# Patient Record
Sex: Male | Born: 1950
Health system: Southern US, Community
[De-identification: ages and names within clinical notes are randomized; demographics above are authoritative.]

## PROBLEM LIST (undated history)

## (undated) DIAGNOSIS — E785 Hyperlipidemia, unspecified: Secondary | ICD-10-CM

## (undated) DIAGNOSIS — K219 Gastro-esophageal reflux disease without esophagitis: Secondary | ICD-10-CM

## (undated) DIAGNOSIS — I10 Essential (primary) hypertension: Secondary | ICD-10-CM

## (undated) DIAGNOSIS — I2510 Atherosclerotic heart disease of native coronary artery without angina pectoris: Secondary | ICD-10-CM

## (undated) HISTORY — DX: Essential (primary) hypertension: I10

## (undated) HISTORY — DX: Hyperlipidemia, unspecified: E78.5

## (undated) HISTORY — PX: HAND TENDON SURGERY: SHX663

## (undated) HISTORY — DX: Atherosclerotic heart disease of native coronary artery without angina pectoris: I25.10

## (undated) HISTORY — DX: Gastro-esophageal reflux disease without esophagitis: K21.9

---

## 2006-01-07 ENCOUNTER — Emergency Department (HOSPITAL_COMMUNITY): Admission: EM | Admit: 2006-01-07 | Discharge: 2006-01-08 | Payer: Self-pay | Admitting: Emergency Medicine

## 2008-08-27 DIAGNOSIS — I10 Essential (primary) hypertension: Secondary | ICD-10-CM

## 2008-08-27 HISTORY — DX: Essential (primary) hypertension: I10

## 2009-06-08 ENCOUNTER — Ambulatory Visit: Payer: Self-pay | Admitting: Thoracic Surgery (Cardiothoracic Vascular Surgery)

## 2009-06-08 ENCOUNTER — Inpatient Hospital Stay (HOSPITAL_COMMUNITY): Admission: AD | Admit: 2009-06-08 | Discharge: 2009-06-15 | Payer: Self-pay | Admitting: Cardiology

## 2009-06-08 HISTORY — PX: CARDIAC CATHETERIZATION: SHX172

## 2009-06-09 ENCOUNTER — Encounter: Payer: Self-pay | Admitting: Thoracic Surgery (Cardiothoracic Vascular Surgery)

## 2009-06-09 HISTORY — PX: OTHER SURGICAL HISTORY: SHX169

## 2009-06-10 HISTORY — PX: CORONARY ARTERY BYPASS GRAFT: SHX141

## 2009-06-10 HISTORY — PX: TEE WITHOUT CARDIOVERSION: SHX5443

## 2009-06-29 ENCOUNTER — Encounter: Admission: RE | Admit: 2009-06-29 | Discharge: 2009-06-29 | Payer: Self-pay | Admitting: Cardiovascular Disease

## 2009-07-04 ENCOUNTER — Ambulatory Visit: Payer: Self-pay | Admitting: Thoracic Surgery (Cardiothoracic Vascular Surgery)

## 2009-07-14 ENCOUNTER — Encounter (HOSPITAL_COMMUNITY): Admission: RE | Admit: 2009-07-14 | Discharge: 2009-08-26 | Payer: Self-pay | Admitting: Cardiovascular Disease

## 2009-07-29 ENCOUNTER — Emergency Department (HOSPITAL_COMMUNITY): Admission: EM | Admit: 2009-07-29 | Discharge: 2009-07-29 | Payer: Self-pay | Admitting: Emergency Medicine

## 2009-08-27 ENCOUNTER — Encounter (HOSPITAL_COMMUNITY): Admission: RE | Admit: 2009-08-27 | Discharge: 2009-09-09 | Payer: Self-pay | Admitting: Cardiovascular Disease

## 2010-09-17 ENCOUNTER — Encounter: Payer: Self-pay | Admitting: Thoracic Surgery (Cardiothoracic Vascular Surgery)

## 2010-10-31 ENCOUNTER — Encounter (INDEPENDENT_AMBULATORY_CARE_PROVIDER_SITE_OTHER): Payer: Self-pay | Admitting: *Deleted

## 2010-11-07 NOTE — Letter (Signed)
Summary: Pre Visit Letter Revised  [facility]  [address] [place], [place] [address]   Phone: [phone]  Fax: [phone]        10/31/2010 MRN: [id number] [facility] [address] [place], [place]  [address]             Procedure Date:  11-28-10           Direct Colon--Dr. Russella Dar   Welcome to the Gastroenterology Division at [facility].    You are scheduled to see a nurse for your pre-procedure visit on 11-14-10 at 3:00p.m. on the 3rd floor at [facility], [address]  We ask that you try to arrive at our office 15 minutes prior to your appointment time to allow for check-in.  Please take a minute to review the attached form.  If you answer "Yes" to one or more of the questions on the first page, we ask that you call the person listed at your earliest opportunity.  If you answer "No" to all of the questions, please complete the rest of the form and bring it to your appointment.    Your nurse visit will consist of discussing your medical and surgical history, your immediate family medical history, and your medications.   If you are unable to list all of your medications on the form, please bring the medication bottles to your appointment and we will list them.  We will need to be aware of both prescribed and over the counter drugs.  We will need to know exact dosage information as well.    Please be prepared to read and sign documents such as consent forms, a financial agreement, and acknowledgement forms.  If necessary, and with your consent, a friend or relative is welcome to sit-in on the nurse visit with you.  Please bring your insurance card so that we may make a copy of it.  If your insurance requires a referral to see a specialist, please bring your referral form from your primary care physician.  No co-pay is required for this nurse visit.     If you cannot keep your appointment, please call [phone] to cancel or reschedule prior to  your appointment date.  This allows [place] the opportunity to schedule an appointment for another patient in need of care.    Thank you for choosing [facility] for your medical needs.  We appreciate the opportunity to care for you.  Please visit [place] at our website  to learn more about our practice.  Sincerely, The Gastroenterology Division

## 2010-11-13 IMAGING — CR DG CHEST 1V PORT
1 series · 1 of 1 positions shown · non-contrast
Comparison: Chest radiograph 06/08/2009

CLINICAL DATA: Chest pain

PORTABLE CHEST - 1 VIEW

[AP]
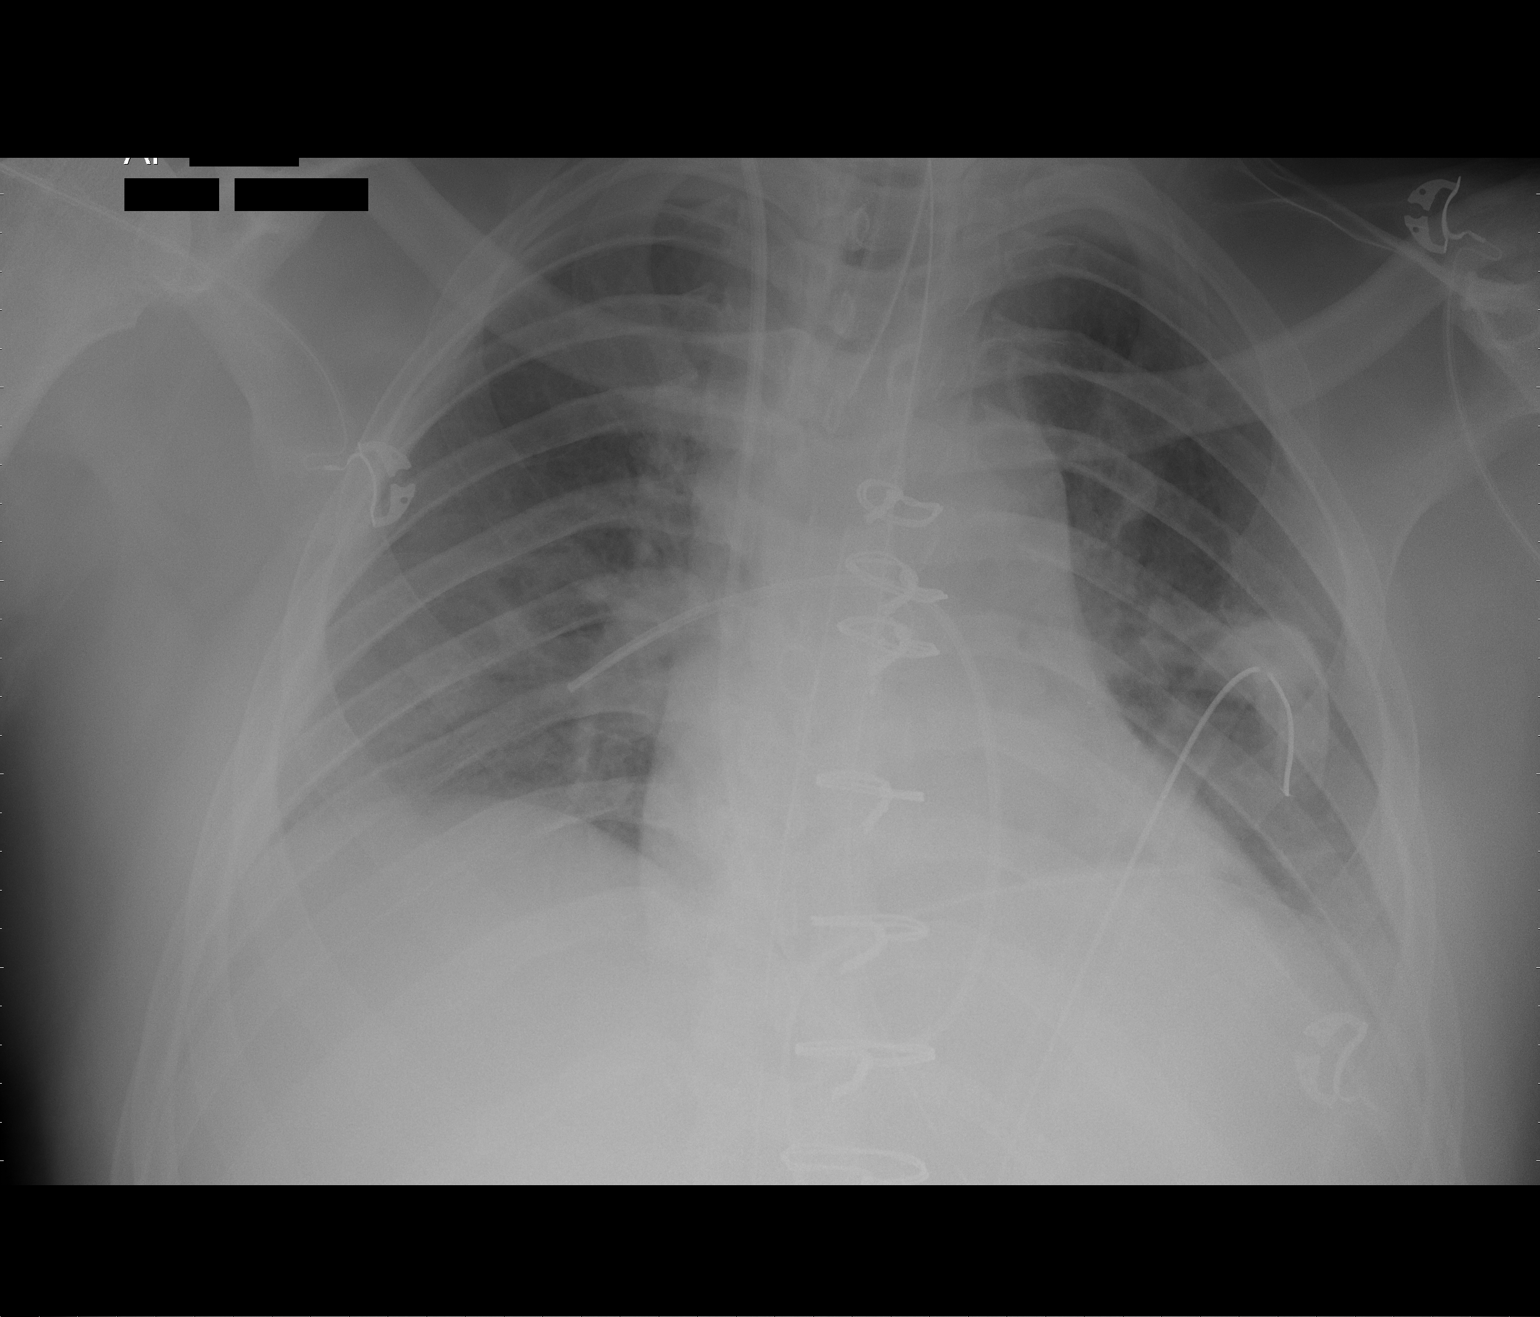

[1 of 1 positions shown; findings below may reference images not displayed]

FINDINGS: Endotracheal tube is in good position 3 cm from carina.
The Swan-Ganz catheter is deep within the right lower lobe
pulmonary artery.  Recommend retraction.  NG tube, mediastinal
drains, and left chest tube in good position.  Left basilar
atelectasis and effusion.  No pneumothorax.  The
IMPRESSION: 1.  Support apparatus in good position.
2.   Consider retraction of Swan-Ganz catheter.

This was made a call report

## 2010-11-14 ENCOUNTER — Ambulatory Visit (AMBULATORY_SURGERY_CENTER): Payer: BC Managed Care – PPO

## 2010-11-14 VITALS — Ht 70.0 in | Wt 221.1 lb

## 2010-11-14 DIAGNOSIS — Z1211 Encounter for screening for malignant neoplasm of colon: Secondary | ICD-10-CM

## 2010-11-14 IMAGING — CR DG CHEST 1V PORT
1 series · 1 of 1 positions shown · non-contrast
Comparison: 06/10/09

CLINICAL DATA: CABG.  Follow-up.

PORTABLE CHEST - 1 VIEW

[view not recorded]
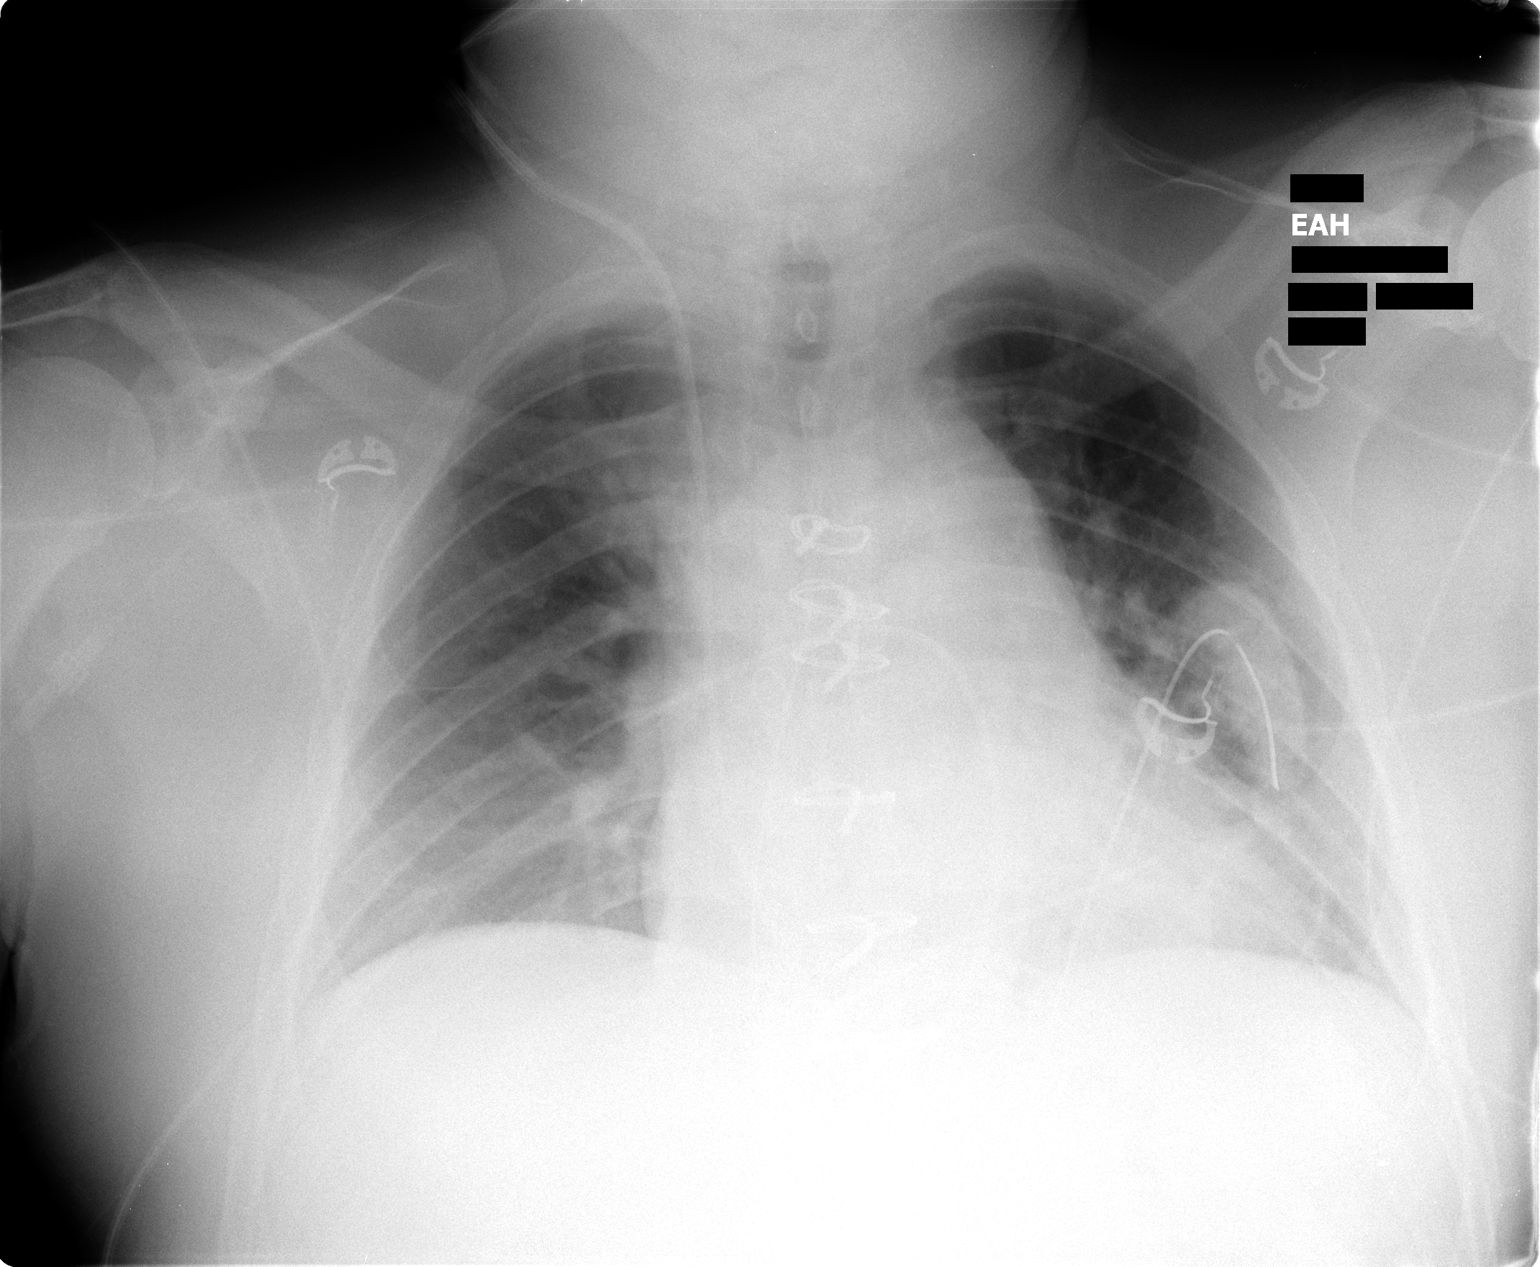

[1 of 1 positions shown; findings below may reference images not displayed]

FINDINGS: Minimal atelectasis at the bases. Mediastinal left
pleural chest tubes in position.  SGC tip is in the descending
portion of the right PA. ETT and NG tube have been removed. No
pneumothorax.  Stable cardiomediastinal silhouette.
IMPRESSION: Minimal atelectasis at the bases.  No acute interval changes.

## 2010-11-14 NOTE — Patient Instructions (Signed)
Pt to pick up prep with in 5 days 

## 2010-11-28 ENCOUNTER — Other Ambulatory Visit: Payer: Self-pay | Admitting: Gastroenterology

## 2010-11-28 LAB — DIFFERENTIAL
Basophils Absolute: 0.1 10*3/uL (ref 0.0–0.1)
Basophils Relative: 1 % (ref 0–1)
Eosinophils Absolute: 0.3 10*3/uL (ref 0.0–0.7)
Eosinophils Relative: 3 % (ref 0–5)
Lymphocytes Relative: 13 % (ref 12–46)
Lymphs Abs: 1.3 10*3/uL (ref 0.7–4.0)
Monocytes Absolute: 1.1 10*3/uL — ABNORMAL HIGH (ref 0.1–1.0)
Monocytes Relative: 10 % (ref 3–12)
Neutro Abs: 7.6 10*3/uL (ref 1.7–7.7)
Neutrophils Relative %: 74 % (ref 43–77)

## 2010-11-28 LAB — COMPREHENSIVE METABOLIC PANEL
ALT: 15 U/L (ref 0–53)
AST: 19 U/L (ref 0–37)
Albumin: 3.7 g/dL (ref 3.5–5.2)
Alkaline Phosphatase: 114 U/L (ref 39–117)
BUN: 12 mg/dL (ref 6–23)
CO2: 26 mEq/L (ref 19–32)
Calcium: 8.7 mg/dL (ref 8.4–10.5)
Chloride: 107 mEq/L (ref 96–112)
Creatinine, Ser: 1.08 mg/dL (ref 0.4–1.5)
GFR calc Af Amer: >60 mL/min (ref >60)
GFR calc non Af Amer: >60 mL/min (ref >60)
Glucose, Bld: 98 mg/dL (ref 70–99)
Potassium: 3.8 mEq/L (ref 3.5–5.1)
Sodium: 139 mEq/L (ref 135–145)
Total Bilirubin: 0.7 mg/dL (ref 0.3–1.2)
Total Protein: 6.8 g/dL (ref 6.0–8.3)

## 2010-11-28 LAB — CBC
HCT: 39.6 % (ref 39.0–52.0)
Hemoglobin: 13.7 g/dL (ref 13.0–17.0)
MCHC: 34.5 g/dL (ref 30.0–36.0)
MCV: 89.3 fL (ref 78.0–100.0)
Platelets: 165 10*3/uL (ref 150–400)
RBC: 4.43 MIL/uL (ref 4.22–5.81)
RDW: 12.6 % (ref 11.5–15.5)
WBC: 10.3 10*3/uL (ref 4.0–10.5)

## 2010-11-28 LAB — POCT CARDIAC MARKERS
CKMB, poc: <1 ng/mL — ABNORMAL LOW (ref 1.0–8.0)
Myoglobin, poc: 82.8 ng/mL (ref 12–200)
Troponin i, poc: <0.05 ng/mL (ref 0.00–0.09)

## 2010-11-30 LAB — BASIC METABOLIC PANEL
BUN: 11 mg/dL (ref 6–23)
BUN: 12 mg/dL (ref 6–23)
BUN: 13 mg/dL (ref 6–23)
BUN: 14 mg/dL (ref 6–23)
BUN: 16 mg/dL (ref 6–23)
BUN: 21 mg/dL (ref 6–23)
CO2: 23 mEq/L (ref 19–32)
CO2: 24 mEq/L (ref 19–32)
CO2: 25 mEq/L (ref 19–32)
CO2: 26 mEq/L (ref 19–32)
CO2: 27 mEq/L (ref 19–32)
CO2: 27 mEq/L (ref 19–32)
Calcium: 7.2 mg/dL — ABNORMAL LOW (ref 8.4–10.5)
Calcium: 7.8 mg/dL — ABNORMAL LOW (ref 8.4–10.5)
Calcium: 8 mg/dL — ABNORMAL LOW (ref 8.4–10.5)
Calcium: 8 mg/dL — ABNORMAL LOW (ref 8.4–10.5)
Calcium: 8.5 mg/dL (ref 8.4–10.5)
Calcium: 8.9 mg/dL (ref 8.4–10.5)
Chloride: 103 mEq/L (ref 96–112)
Chloride: 104 mEq/L (ref 96–112)
Chloride: 104 mEq/L (ref 96–112)
Chloride: 104 mEq/L (ref 96–112)
Chloride: 105 mEq/L (ref 96–112)
Chloride: 106 mEq/L (ref 96–112)
Creatinine, Ser: 0.93 mg/dL (ref 0.4–1.5)
Creatinine, Ser: 1 mg/dL (ref 0.4–1.5)
Creatinine, Ser: 1.04 mg/dL (ref 0.4–1.5)
Creatinine, Ser: 1.05 mg/dL (ref 0.4–1.5)
Creatinine, Ser: 1.15 mg/dL (ref 0.4–1.5)
Creatinine, Ser: 1.32 mg/dL (ref 0.4–1.5)
GFR calc Af Amer: >60 mL/min (ref >60)
GFR calc Af Amer: >60 mL/min (ref >60)
GFR calc Af Amer: >60 mL/min (ref >60)
GFR calc Af Amer: >60 mL/min (ref >60)
GFR calc Af Amer: >60 mL/min (ref >60)
GFR calc Af Amer: >60 mL/min (ref >60)
GFR calc non Af Amer: 56 mL/min — ABNORMAL LOW (ref >60)
GFR calc non Af Amer: >60 mL/min (ref >60)
GFR calc non Af Amer: >60 mL/min (ref >60)
GFR calc non Af Amer: >60 mL/min (ref >60)
GFR calc non Af Amer: >60 mL/min (ref >60)
GFR calc non Af Amer: >60 mL/min (ref >60)
Glucose, Bld: 108 mg/dL — ABNORMAL HIGH (ref 70–99)
Glucose, Bld: 111 mg/dL — ABNORMAL HIGH (ref 70–99)
Glucose, Bld: 112 mg/dL — ABNORMAL HIGH (ref 70–99)
Glucose, Bld: 124 mg/dL — ABNORMAL HIGH (ref 70–99)
Glucose, Bld: 88 mg/dL (ref 70–99)
Glucose, Bld: 92 mg/dL (ref 70–99)
Potassium: 3.3 mEq/L — ABNORMAL LOW (ref 3.5–5.1)
Potassium: 3.7 mEq/L (ref 3.5–5.1)
Potassium: 3.7 mEq/L (ref 3.5–5.1)
Potassium: 3.9 mEq/L (ref 3.5–5.1)
Potassium: 3.9 mEq/L (ref 3.5–5.1)
Potassium: 4.1 mEq/L (ref 3.5–5.1)
Sodium: 131 mEq/L — ABNORMAL LOW (ref 135–145)
Sodium: 132 mEq/L — ABNORMAL LOW (ref 135–145)
Sodium: 136 mEq/L (ref 135–145)
Sodium: 137 mEq/L (ref 135–145)
Sodium: 138 mEq/L (ref 135–145)
Sodium: 140 mEq/L (ref 135–145)

## 2010-11-30 LAB — POCT I-STAT 4, (NA,K, GLUC, HGB,HCT)
Glucose, Bld: 112 mg/dL — ABNORMAL HIGH (ref 70–99)
Glucose, Bld: 115 mg/dL — ABNORMAL HIGH (ref 70–99)
Glucose, Bld: 118 mg/dL — ABNORMAL HIGH (ref 70–99)
Glucose, Bld: 121 mg/dL — ABNORMAL HIGH (ref 70–99)
Glucose, Bld: 95 mg/dL (ref 70–99)
Glucose, Bld: 97 mg/dL (ref 70–99)
HCT: 28 % — ABNORMAL LOW (ref 39.0–52.0)
HCT: 29 % — ABNORMAL LOW (ref 39.0–52.0)
HCT: 29 % — ABNORMAL LOW (ref 39.0–52.0)
HCT: 34 % — ABNORMAL LOW (ref 39.0–52.0)
HCT: 35 % — ABNORMAL LOW (ref 39.0–52.0)
HCT: 40 % (ref 39.0–52.0)
Hemoglobin: 11.6 g/dL — ABNORMAL LOW (ref 13.0–17.0)
Hemoglobin: 11.9 g/dL — ABNORMAL LOW (ref 13.0–17.0)
Hemoglobin: 13.6 g/dL (ref 13.0–17.0)
Hemoglobin: 9.5 g/dL — ABNORMAL LOW (ref 13.0–17.0)
Hemoglobin: 9.9 g/dL — ABNORMAL LOW (ref 13.0–17.0)
Hemoglobin: 9.9 g/dL — ABNORMAL LOW (ref 13.0–17.0)
Potassium: 3.7 mEq/L (ref 3.5–5.1)
Potassium: 3.8 mEq/L (ref 3.5–5.1)
Potassium: 4 mEq/L (ref 3.5–5.1)
Potassium: 4.6 mEq/L (ref 3.5–5.1)
Potassium: 4.7 mEq/L (ref 3.5–5.1)
Potassium: 5.1 mEq/L (ref 3.5–5.1)
Sodium: 134 mEq/L — ABNORMAL LOW (ref 135–145)
Sodium: 136 mEq/L (ref 135–145)
Sodium: 138 mEq/L (ref 135–145)
Sodium: 138 mEq/L (ref 135–145)
Sodium: 138 mEq/L (ref 135–145)
Sodium: 140 mEq/L (ref 135–145)

## 2010-11-30 LAB — URINE CULTURE
Colony Count: NO GROWTH
Colony Count: NO GROWTH
Colony Count: NO GROWTH
Culture: NO GROWTH
Culture: NO GROWTH
Culture: NO GROWTH

## 2010-11-30 LAB — HEMOGLOBIN A1C
Hgb A1c MFr Bld: 5.9 % (ref 4.6–6.1)
Mean Plasma Glucose: 123 mg/dL

## 2010-11-30 LAB — LIPID PANEL
Cholesterol: 129 mg/dL (ref 0–200)
HDL: 35 mg/dL — ABNORMAL LOW (ref >39)
LDL Cholesterol: 79 mg/dL (ref 0–99)
Total CHOL/HDL Ratio: 3.7 RATIO
Triglycerides: 77 mg/dL (ref <150)
VLDL: 15 mg/dL (ref 0–40)

## 2010-11-30 LAB — MAGNESIUM
Magnesium: 1.9 mg/dL (ref 1.5–2.5)
Magnesium: 2 mg/dL (ref 1.5–2.5)
Magnesium: 2.3 mg/dL (ref 1.5–2.5)
Magnesium: 2.3 mg/dL (ref 1.5–2.5)
Magnesium: 2.4 mg/dL (ref 1.5–2.5)
Magnesium: 2.8 mg/dL — ABNORMAL HIGH (ref 1.5–2.5)

## 2010-11-30 LAB — CBC
HCT: 30.9 % — ABNORMAL LOW (ref 39.0–52.0)
HCT: 32.8 % — ABNORMAL LOW (ref 39.0–52.0)
HCT: 34.2 % — ABNORMAL LOW (ref 39.0–52.0)
HCT: 35.1 % — ABNORMAL LOW (ref 39.0–52.0)
HCT: 36.8 % — ABNORMAL LOW (ref 39.0–52.0)
HCT: 36.8 % — ABNORMAL LOW (ref 39.0–52.0)
HCT: 38.8 % — ABNORMAL LOW (ref 39.0–52.0)
HCT: 40 % (ref 39.0–52.0)
HCT: 40.8 % (ref 39.0–52.0)
HCT: 43 % (ref 39.0–52.0)
Hemoglobin: 10.7 g/dL — ABNORMAL LOW (ref 13.0–17.0)
Hemoglobin: 11.4 g/dL — ABNORMAL LOW (ref 13.0–17.0)
Hemoglobin: 12 g/dL — ABNORMAL LOW (ref 13.0–17.0)
Hemoglobin: 12.2 g/dL — ABNORMAL LOW (ref 13.0–17.0)
Hemoglobin: 12.6 g/dL — ABNORMAL LOW (ref 13.0–17.0)
Hemoglobin: 12.7 g/dL — ABNORMAL LOW (ref 13.0–17.0)
Hemoglobin: 13.3 g/dL (ref 13.0–17.0)
Hemoglobin: 13.5 g/dL (ref 13.0–17.0)
Hemoglobin: 14 g/dL (ref 13.0–17.0)
Hemoglobin: 14.5 g/dL (ref 13.0–17.0)
MCHC: 33.7 g/dL (ref 30.0–36.0)
MCHC: 33.8 g/dL (ref 30.0–36.0)
MCHC: 34.2 g/dL (ref 30.0–36.0)
MCHC: 34.3 g/dL (ref 30.0–36.0)
MCHC: 34.4 g/dL (ref 30.0–36.0)
MCHC: 34.5 g/dL (ref 30.0–36.0)
MCHC: 34.6 g/dL (ref 30.0–36.0)
MCHC: 34.8 g/dL (ref 30.0–36.0)
MCHC: 34.8 g/dL (ref 30.0–36.0)
MCHC: 35 g/dL (ref 30.0–36.0)
MCV: 90.3 fL (ref 78.0–100.0)
MCV: 90.5 fL (ref 78.0–100.0)
MCV: 90.8 fL (ref 78.0–100.0)
MCV: 90.9 fL (ref 78.0–100.0)
MCV: 90.9 fL (ref 78.0–100.0)
MCV: 91.1 fL (ref 78.0–100.0)
MCV: 91.2 fL (ref 78.0–100.0)
MCV: 91.6 fL (ref 78.0–100.0)
MCV: 91.7 fL (ref 78.0–100.0)
MCV: 92.6 fL (ref 78.0–100.0)
Platelets: 105 10*3/uL — ABNORMAL LOW (ref 150–400)
Platelets: 112 10*3/uL — ABNORMAL LOW (ref 150–400)
Platelets: 131 10*3/uL — ABNORMAL LOW (ref 150–400)
Platelets: 145 10*3/uL — ABNORMAL LOW (ref 150–400)
Platelets: 146 10*3/uL — ABNORMAL LOW (ref 150–400)
Platelets: 153 10*3/uL (ref 150–400)
Platelets: 155 10*3/uL (ref 150–400)
Platelets: 90 10*3/uL — ABNORMAL LOW (ref 150–400)
Platelets: 93 10*3/uL — ABNORMAL LOW (ref 150–400)
Platelets: 99 10*3/uL — ABNORMAL LOW (ref 150–400)
RBC: 3.4 MIL/uL — ABNORMAL LOW (ref 4.22–5.81)
RBC: 3.62 MIL/uL — ABNORMAL LOW (ref 4.22–5.81)
RBC: 3.79 MIL/uL — ABNORMAL LOW (ref 4.22–5.81)
RBC: 3.86 MIL/uL — ABNORMAL LOW (ref 4.22–5.81)
RBC: 3.97 MIL/uL — ABNORMAL LOW (ref 4.22–5.81)
RBC: 4.05 MIL/uL — ABNORMAL LOW (ref 4.22–5.81)
RBC: 4.23 MIL/uL (ref 4.22–5.81)
RBC: 4.36 MIL/uL (ref 4.22–5.81)
RBC: 4.47 MIL/uL (ref 4.22–5.81)
RBC: 4.72 MIL/uL (ref 4.22–5.81)
RDW: 12.9 % (ref 11.5–15.5)
RDW: 12.9 % (ref 11.5–15.5)
RDW: 13 % (ref 11.5–15.5)
RDW: 13 % (ref 11.5–15.5)
RDW: 13.1 % (ref 11.5–15.5)
RDW: 13.1 % (ref 11.5–15.5)
RDW: 13.2 % (ref 11.5–15.5)
RDW: 13.3 % (ref 11.5–15.5)
RDW: 13.3 % (ref 11.5–15.5)
RDW: 13.4 % (ref 11.5–15.5)
WBC: 10.9 10*3/uL — ABNORMAL HIGH (ref 4.0–10.5)
WBC: 11.6 10*3/uL — ABNORMAL HIGH (ref 4.0–10.5)
WBC: 11.8 10*3/uL — ABNORMAL HIGH (ref 4.0–10.5)
WBC: 11.9 10*3/uL — ABNORMAL HIGH (ref 4.0–10.5)
WBC: 11.9 10*3/uL — ABNORMAL HIGH (ref 4.0–10.5)
WBC: 12.8 10*3/uL — ABNORMAL HIGH (ref 4.0–10.5)
WBC: 13.5 10*3/uL — ABNORMAL HIGH (ref 4.0–10.5)
WBC: 8.1 10*3/uL (ref 4.0–10.5)
WBC: 9.7 10*3/uL (ref 4.0–10.5)
WBC: 9.9 10*3/uL (ref 4.0–10.5)

## 2010-11-30 LAB — POCT I-STAT, CHEM 8
BUN: 12 mg/dL (ref 6–23)
BUN: 17 mg/dL (ref 6–23)
Calcium, Ion: 1.06 mmol/L — ABNORMAL LOW (ref 1.12–1.32)
Calcium, Ion: 1.12 mmol/L (ref 1.12–1.32)
Chloride: 102 mEq/L (ref 96–112)
Chloride: 107 mEq/L (ref 96–112)
Creatinine, Ser: 0.9 mg/dL (ref 0.4–1.5)
Creatinine, Ser: 1 mg/dL (ref 0.4–1.5)
Glucose, Bld: 112 mg/dL — ABNORMAL HIGH (ref 70–99)
Glucose, Bld: 119 mg/dL — ABNORMAL HIGH (ref 70–99)
HCT: 32 % — ABNORMAL LOW (ref 39.0–52.0)
HCT: 38 % — ABNORMAL LOW (ref 39.0–52.0)
Hemoglobin: 10.9 g/dL — ABNORMAL LOW (ref 13.0–17.0)
Hemoglobin: 12.9 g/dL — ABNORMAL LOW (ref 13.0–17.0)
Potassium: 4.2 mEq/L (ref 3.5–5.1)
Potassium: 4.5 mEq/L (ref 3.5–5.1)
Sodium: 136 mEq/L (ref 135–145)
Sodium: 139 mEq/L (ref 135–145)
TCO2: 20 mmol/L (ref 0–100)
TCO2: 23 mmol/L (ref 0–100)

## 2010-11-30 LAB — POCT I-STAT 3, ART BLOOD GAS (G3+)
Acid-base deficit: 1 mmol/L (ref 0.0–2.0)
Acid-base deficit: 2 mmol/L (ref 0.0–2.0)
Acid-base deficit: 2 mmol/L (ref 0.0–2.0)
Acid-base deficit: 4 mmol/L — ABNORMAL HIGH (ref 0.0–2.0)
Bicarbonate: 21.2 mEq/L (ref 20.0–24.0)
Bicarbonate: 22.7 mEq/L (ref 20.0–24.0)
Bicarbonate: 23 mEq/L (ref 20.0–24.0)
Bicarbonate: 24.6 mEq/L — ABNORMAL HIGH (ref 20.0–24.0)
O2 Saturation: 100 %
O2 Saturation: 95 %
O2 Saturation: 96 %
O2 Saturation: 97 %
Patient temperature: 36.3
Patient temperature: 37.4
Patient temperature: 38.1
TCO2: 22 mmol/L (ref 0–100)
TCO2: 24 mmol/L (ref 0–100)
TCO2: 24 mmol/L (ref 0–100)
TCO2: 26 mmol/L (ref 0–100)
pCO2 arterial: 38 mmHg (ref 35.0–45.0)
pCO2 arterial: 38 mmHg (ref 35.0–45.0)
pCO2 arterial: 39.4 mmHg (ref 35.0–45.0)
pCO2 arterial: 41.8 mmHg (ref 35.0–45.0)
pH, Arterial: 7.343 — ABNORMAL LOW (ref 7.350–7.450)
pH, Arterial: 7.377 (ref 7.350–7.450)
pH, Arterial: 7.385 (ref 7.350–7.450)
pH, Arterial: 7.386 (ref 7.350–7.450)
pO2, Arterial: 105 mmHg — ABNORMAL HIGH (ref 80.0–100.0)
pO2, Arterial: 331 mmHg — ABNORMAL HIGH (ref 80.0–100.0)
pO2, Arterial: 78 mmHg — ABNORMAL LOW (ref 80.0–100.0)
pO2, Arterial: 80 mmHg (ref 80.0–100.0)

## 2010-11-30 LAB — GLUCOSE, CAPILLARY
Glucose-Capillary: 104 mg/dL — ABNORMAL HIGH (ref 70–99)
Glucose-Capillary: 105 mg/dL — ABNORMAL HIGH (ref 70–99)
Glucose-Capillary: 110 mg/dL — ABNORMAL HIGH (ref 70–99)
Glucose-Capillary: 112 mg/dL — ABNORMAL HIGH (ref 70–99)
Glucose-Capillary: 113 mg/dL — ABNORMAL HIGH (ref 70–99)
Glucose-Capillary: 117 mg/dL — ABNORMAL HIGH (ref 70–99)
Glucose-Capillary: 135 mg/dL — ABNORMAL HIGH (ref 70–99)
Glucose-Capillary: 89 mg/dL (ref 70–99)
Glucose-Capillary: 91 mg/dL (ref 70–99)
Glucose-Capillary: 97 mg/dL (ref 70–99)

## 2010-11-30 LAB — BLOOD GAS, ARTERIAL
Acid-Base Excess: 1.1 mmol/L (ref 0.0–2.0)
Bicarbonate: 25.2 mEq/L — ABNORMAL HIGH (ref 20.0–24.0)
FIO2: 0.21 %
O2 Saturation: 94.4 %
Patient temperature: 98
TCO2: 26.4 mmol/L (ref 0–100)
pCO2 arterial: 39.8 mmHg (ref 35.0–45.0)
pH, Arterial: 7.416 (ref 7.350–7.450)
pO2, Arterial: 67.5 mmHg — ABNORMAL LOW (ref 80.0–100.0)

## 2010-11-30 LAB — CARDIAC PANEL(CRET KIN+CKTOT+MB+TROPI)
CK, MB: 2 ng/mL (ref 0.3–4.0)
Relative Index: 1.3 (ref 0.0–2.5)
Total CK: 151 U/L (ref 7–232)
Troponin I: 0.04 ng/mL (ref 0.00–0.06)

## 2010-11-30 LAB — URINALYSIS, ROUTINE W REFLEX MICROSCOPIC
Bilirubin Urine: NEGATIVE
Glucose, UA: NEGATIVE mg/dL
Ketones, ur: NEGATIVE mg/dL
Leukocytes, UA: NEGATIVE
Nitrite: NEGATIVE
Protein, ur: NEGATIVE mg/dL
Specific Gravity, Urine: >1.046 — ABNORMAL HIGH (ref 1.005–1.030)
Urobilinogen, UA: 0.2 mg/dL (ref 0.0–1.0)
pH: 7 (ref 5.0–8.0)

## 2010-11-30 LAB — COMPREHENSIVE METABOLIC PANEL
ALT: 37 U/L (ref 0–53)
AST: 33 U/L (ref 0–37)
Albumin: 3.6 g/dL (ref 3.5–5.2)
Alkaline Phosphatase: 90 U/L (ref 39–117)
BUN: 12 mg/dL (ref 6–23)
CO2: 27 mEq/L (ref 19–32)
Calcium: 8.8 mg/dL (ref 8.4–10.5)
Chloride: 106 mEq/L (ref 96–112)
Creatinine, Ser: 1.02 mg/dL (ref 0.4–1.5)
GFR calc Af Amer: >60 mL/min (ref >60)
GFR calc non Af Amer: >60 mL/min (ref >60)
Glucose, Bld: 87 mg/dL (ref 70–99)
Potassium: 4 mEq/L (ref 3.5–5.1)
Sodium: 139 mEq/L (ref 135–145)
Total Bilirubin: 0.7 mg/dL (ref 0.3–1.2)
Total Protein: 6.7 g/dL (ref 6.0–8.3)

## 2010-11-30 LAB — MRSA PCR SCREENING: MRSA by PCR: NEGATIVE

## 2010-11-30 LAB — PLATELET COUNT: Platelets: 116 10*3/uL — ABNORMAL LOW (ref 150–400)

## 2010-11-30 LAB — HEMOGLOBIN AND HEMATOCRIT, BLOOD
HCT: 29.5 % — ABNORMAL LOW (ref 39.0–52.0)
Hemoglobin: 10.2 g/dL — ABNORMAL LOW (ref 13.0–17.0)

## 2010-11-30 LAB — DIFFERENTIAL
Basophils Absolute: 0 10*3/uL (ref 0.0–0.1)
Basophils Relative: 0 % (ref 0–1)
Eosinophils Absolute: 0.1 10*3/uL (ref 0.0–0.7)
Eosinophils Relative: 2 % (ref 0–5)
Lymphocytes Relative: 21 % (ref 12–46)
Lymphs Abs: 1.7 10*3/uL (ref 0.7–4.0)
Monocytes Absolute: 0.8 10*3/uL (ref 0.1–1.0)
Monocytes Relative: 10 % (ref 3–12)
Neutro Abs: 5.4 10*3/uL (ref 1.7–7.7)
Neutrophils Relative %: 66 % (ref 43–77)

## 2010-11-30 LAB — APTT
aPTT: 30 seconds (ref 24–37)
aPTT: 39 seconds — ABNORMAL HIGH (ref 24–37)

## 2010-11-30 LAB — URINALYSIS, MICROSCOPIC ONLY
Bilirubin Urine: NEGATIVE
Glucose, UA: NEGATIVE mg/dL
Ketones, ur: 15 mg/dL — AB
Nitrite: NEGATIVE
Protein, ur: NEGATIVE mg/dL
Specific Gravity, Urine: 1.031 — ABNORMAL HIGH (ref 1.005–1.030)
Urobilinogen, UA: 0.2 mg/dL (ref 0.0–1.0)
pH: 6 (ref 5.0–8.0)

## 2010-11-30 LAB — URINE MICROSCOPIC-ADD ON

## 2010-11-30 LAB — CREATININE, SERUM
Creatinine, Ser: 0.94 mg/dL (ref 0.4–1.5)
Creatinine, Ser: 0.97 mg/dL (ref 0.4–1.5)
GFR calc Af Amer: >60 mL/min (ref >60)
GFR calc Af Amer: >60 mL/min (ref >60)
GFR calc non Af Amer: >60 mL/min (ref >60)
GFR calc non Af Amer: >60 mL/min (ref >60)

## 2010-11-30 LAB — PROTIME-INR
INR: 1.16 (ref 0.00–1.49)
INR: 1.64 — ABNORMAL HIGH (ref 0.00–1.49)
Prothrombin Time: 14.7 seconds (ref 11.6–15.2)
Prothrombin Time: 19.3 seconds — ABNORMAL HIGH (ref 11.6–15.2)

## 2010-11-30 LAB — ABO/RH: ABO/RH(D): O POS

## 2010-11-30 LAB — HEPARIN LEVEL (UNFRACTIONATED)
Heparin Unfractionated: 0.41 IU/mL (ref 0.30–0.70)
Heparin Unfractionated: 0.66 IU/mL (ref 0.30–0.70)
Heparin Unfractionated: 0.92 IU/mL — ABNORMAL HIGH (ref 0.30–0.70)

## 2010-11-30 LAB — TYPE AND SCREEN
ABO/RH(D): O POS
Antibody Screen: NEGATIVE

## 2010-11-30 LAB — TSH: TSH: 1.302 u[IU]/mL (ref 0.350–4.500)

## 2010-12-02 IMAGING — CR DG CHEST 2V
2 series · 2 of 2 positions shown · non-contrast
Comparison: 06/14/2009

CLINICAL DATA: Post CABG.

CHEST - 2 VIEW

[view not recorded (1 of 2)]
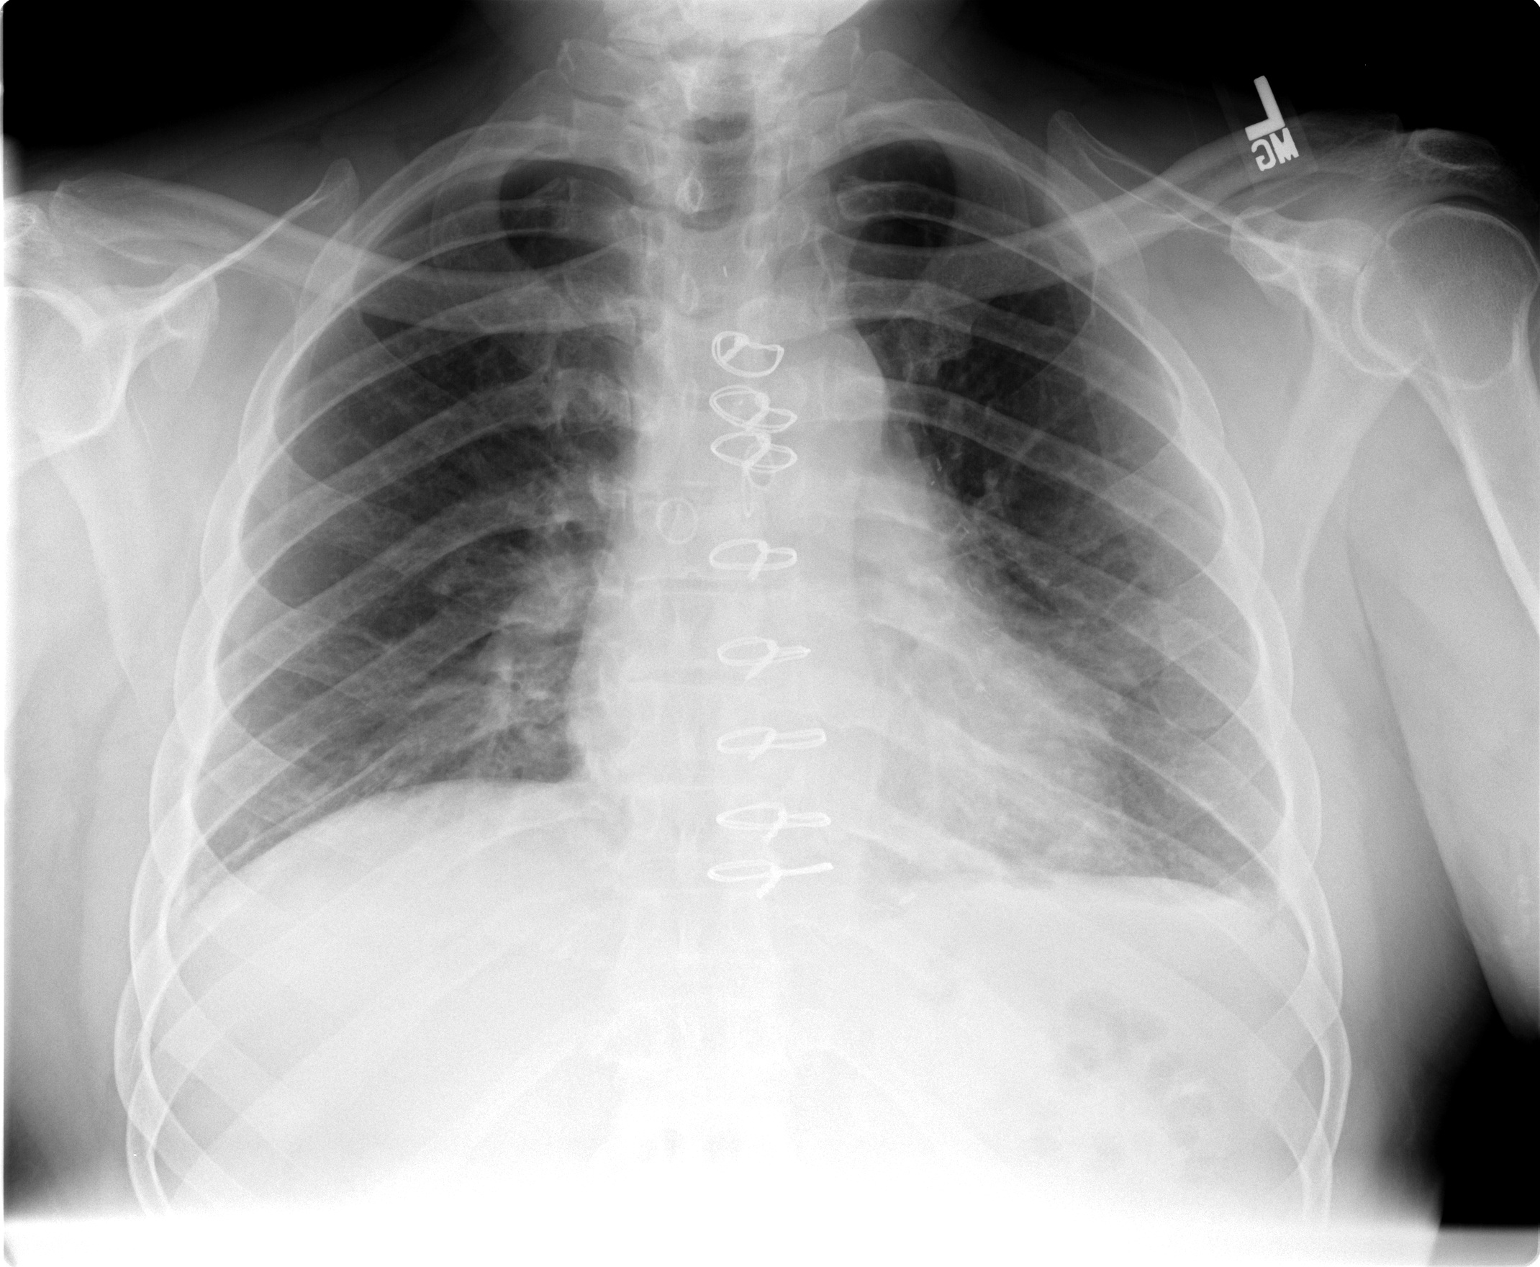

[view not recorded (2 of 2)]
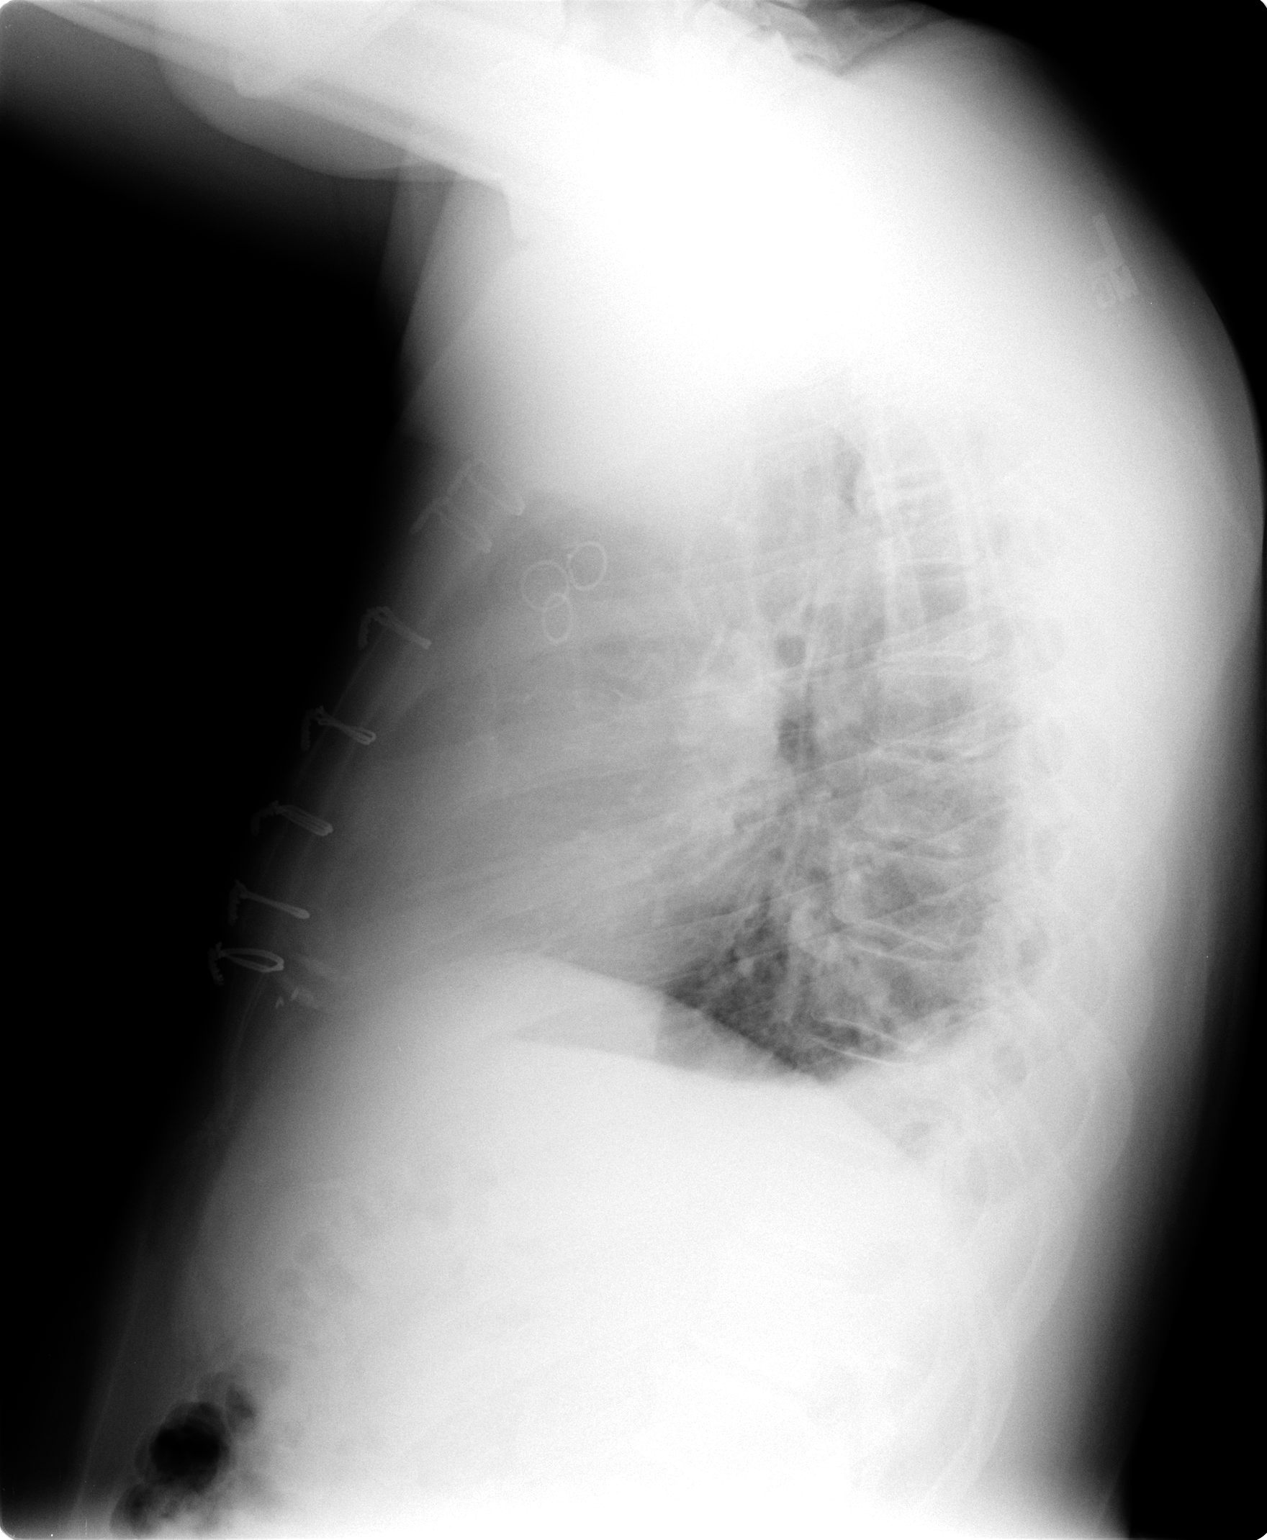

[2 of 2 positions shown; findings below may reference images not displayed]

FINDINGS: Trachea is midline.  Heart size normal.  Sternotomy wires
are stable in position.  Minimal bibasilar atelectasis and tiny
bilateral pleural effusions.
IMPRESSION: Minimal bibasilar atelectasis and tiny bilateral pleural effusions.

## 2011-01-09 NOTE — Assessment & Plan Note (Signed)
OFFICE VISIT   SON, Don Aguilar  DOB:  03/17/1951                                        July 04, 2009  CHART #:  [id number]   HISTORY:  The patient returns to the office today for routine follow up  status post coronary artery bypass grafting x4 on June 10, 2009.  His  postoperative recovery has been uncomplicated.  Following hospital  discharge, he has continued to do well.  He was seen in the office last  week by Dr. Tresa Endo and he returns to our office for a routine follow up  today.  The patient reports that he now has only mild residual soreness  in his chest.  He is no longer taking any pain medicine.  His appetite  has been improving.  He is now sleeping well at night.  He has not had  problems with shortness of breath.  Overall, he has no complaints.  Medications remain unchanged from the time of hospital discharge.   PHYSICAL EXAMINATION:  Notable for well-appearing male with blood  pressure 109/67, pulse 76 and regular, oxygen saturation 96% on room  air.  Examination of the chest demonstrates a median sternotomy incision  that is healing nicely.  The sternum is stable on palpation.  Breath  sounds are clear to auscultation and symmetrical bilaterally.  No  wheezes or rhonchi are noted.  Cardiovascular exam includes regular rate  and rhythm.  No murmurs, rubs, or gallops are noted.  The abdomen is  soft, nontender.  The extremities are warm and well perfused.  The small  incision below the right knee from endoscopic vein harvest is healing  nicely.  There is no lower extremity edema.  The remainder of his  physical exam is unremarkable.   DIAGNOSTIC TESTS:  Chest x-ray performed on June 29, 2009, is  reviewed.  This demonstrates clear lung fields bilaterally with minimal  bibasilar atelectasis.  There are no pleural effusions.  No other  abnormalities are noted.  All the sternal wires appear intact.   IMPRESSION:  Satisfactory progress  following recent coronary artery  bypass grafting.   PLAN:  I have encouraged the patient to continue to gradually increase  his physical activity as tolerated with his only limitation at this  point remaining that he refrain from heavy lifting or strenuous use of  his arms or shoulders for at least another 2-3 months.  I have strongly  encouraged him to get involved in the cardiac rehab program.  We have  not made any changes in his current medications.  All of his questions  have been addressed.  In the future, the patient will call and return to  see [place] here at [facility] only should further  problems or difficulties arise.   Salvatore Decent. Cornelius Moras, M.D.  Electronically Signed   CHO/MEDQ  D:  07/04/2009  T:  07/04/2009  Job:  [id number]   cc:   Nicki Guadalajara, M.D.  Sheliah Mends, MD

## 2012-05-30 HISTORY — PX: CARDIOVASCULAR STRESS TEST: SHX262

## 2013-02-23 ENCOUNTER — Other Ambulatory Visit: Payer: Self-pay | Admitting: Cardiovascular Disease

## 2013-02-23 NOTE — Telephone Encounter (Signed)
Rx was sent to pharmacy electronically. 

## 2013-03-23 ENCOUNTER — Other Ambulatory Visit: Payer: Self-pay | Admitting: *Deleted

## 2013-03-23 MED ORDER — ROSUVASTATIN CALCIUM 10 MG PO TABS: 10.0000 mg | ORAL_TABLET | Freq: Every day | ORAL | Status: DC

## 2013-04-17 ENCOUNTER — Telehealth: Payer: Self-pay | Admitting: Family Medicine

## 2013-04-17 MED ORDER — OMEPRAZOLE 20 MG PO CPDR: 20.0000 mg | DELAYED_RELEASE_CAPSULE | Freq: Every day | ORAL | Status: DC

## 2013-04-17 NOTE — Telephone Encounter (Signed)
Omeprazole 20 mg cap 1 BID #60 °

## 2013-04-17 NOTE — Telephone Encounter (Signed)
Medication refilled per protocol. 

## 2013-04-23 ENCOUNTER — Telehealth: Payer: Self-pay | Admitting: Family Medicine

## 2013-04-23 MED ORDER — OMEPRAZOLE 20 MG PO CPDR: 20.0000 mg | DELAYED_RELEASE_CAPSULE | Freq: Two times a day (BID) | ORAL | Status: DC

## 2013-04-23 NOTE — Telephone Encounter (Signed)
Rx Refilled  

## 2013-04-23 NOTE — Telephone Encounter (Signed)
Omeprazole 20 mg cap 1 BID #60

## 2013-07-27 ENCOUNTER — Other Ambulatory Visit: Payer: Self-pay | Admitting: Cardiovascular Disease

## 2013-07-27 NOTE — Telephone Encounter (Signed)
Rx was sent to pharmacy electronically. 

## 2013-07-30 ENCOUNTER — Other Ambulatory Visit: Payer: Self-pay | Admitting: Cardiovascular Disease

## 2013-07-30 NOTE — Telephone Encounter (Signed)
Rx was sent to pharmacy electronically. 

## 2013-08-29 ENCOUNTER — Other Ambulatory Visit: Payer: Self-pay | Admitting: Cardiovascular Disease

## 2013-09-02 ENCOUNTER — Other Ambulatory Visit: Payer: Self-pay | Admitting: Cardiovascular Disease

## 2013-09-02 NOTE — Telephone Encounter (Signed)
Rx was sent to pharmacy electronically. 

## 2013-09-04 ENCOUNTER — Other Ambulatory Visit: Payer: Self-pay | Admitting: Cardiovascular Disease

## 2013-09-04 NOTE — Telephone Encounter (Signed)
Rx was sent to pharmacy electronically. 

## 2013-09-10 ENCOUNTER — Encounter: Payer: Self-pay | Admitting: Family Medicine

## 2013-09-10 NOTE — Telephone Encounter (Signed)
PT wife is calling today in regards to her husbands order for his Colonoscopy  Call back number is [phone]

## 2013-09-18 NOTE — Telephone Encounter (Signed)
This encounter was created in error - please disregard.

## 2013-09-20 ENCOUNTER — Other Ambulatory Visit: Payer: Self-pay | Admitting: Cardiovascular Disease

## 2013-09-22 ENCOUNTER — Other Ambulatory Visit: Payer: Self-pay | Admitting: Cardiovascular Disease

## 2013-10-02 ENCOUNTER — Ambulatory Visit (INDEPENDENT_AMBULATORY_CARE_PROVIDER_SITE_OTHER): Payer: BC Managed Care – PPO | Admitting: Family Medicine

## 2013-10-02 ENCOUNTER — Encounter: Payer: Self-pay | Admitting: Family Medicine

## 2013-10-02 VITALS — BP 118/60 | HR 62 | Temp 97.6°F | Resp 18 | Ht 69.0 in | Wt 228.0 lb

## 2013-10-02 DIAGNOSIS — J209 Acute bronchitis, unspecified: Secondary | ICD-10-CM

## 2013-10-02 DIAGNOSIS — Z125 Encounter for screening for malignant neoplasm of prostate: Secondary | ICD-10-CM

## 2013-10-02 DIAGNOSIS — I2510 Atherosclerotic heart disease of native coronary artery without angina pectoris: Secondary | ICD-10-CM

## 2013-10-02 DIAGNOSIS — Z86010 Personal history of colonic polyps: Secondary | ICD-10-CM

## 2013-10-02 MED ORDER — AZITHROMYCIN 250 MG PO TABS: ORAL_TABLET | ORAL | Status: DC

## 2013-10-02 MED ORDER — ALBUTEROL SULFATE HFA 108 (90 BASE) MCG/ACT IN AERS: 2.0000 | INHALATION_SPRAY | Freq: Four times a day (QID) | RESPIRATORY_TRACT | Status: DC | PRN

## 2013-10-02 NOTE — Progress Notes (Signed)
Subjective:    Patient ID: Don Aguilar, male    DOB: Nov 30, 1950, 63 y.o.   MRN: [id number]  HPI  Patient saw a gastroenterologist in [facility] one year ago and had a colonoscopy.  They found a colon polyp that they were unable to biopsy. At that time they referred him to a general surgeon for an open biopsy. The general surgeon recommended a referral [facility] for a repeat colonoscopy when they may be able to perform the biopsy of polyp without requiring such an invasive procedure. I have no records to review today. The patient was unsure whether they actually biopsied the polyp or if they found cancer.   Furthermore he has a history of coronary artery disease. He is on Toprol Crestor and Ramapril.  He has not had blood work in over a year and a half.. He also has had one week history of cough. The cough is productive of purulent sputum. He reports fevers and chills. He reports wheezing and shortness of breath. Past Medical History  Diagnosis Date  . Hypertension 2010  . GERD (gastroesophageal reflux disease)    Current Outpatient Prescriptions on File Prior to Visit  Medication Sig Dispense Refill  . aspirin 81 MG tablet Take 81 mg by mouth daily.        . CRESTOR 10 MG tablet TAKE ONE TABLET BY MOUTH ONCE DAILY. (PATIENT NEEDS TO CALL OFFICE TO SCHEDULE APPOINTMENT).  15 tablet  0  . omeprazole (PRILOSEC) 20 MG capsule Take 1 capsule (20 mg total) by mouth 2 (two) times daily.  60 capsule  5   No current facility-administered medications on file prior to visit.   No Known Allergies History   Social History  . Marital Status: Married    Spouse Name: N/A    Number of Children: N/A  . Years of Education: N/A   Occupational History  . Not on file.   Social History Main Topics  . Smoking status: Former Smoker -- 2.00 packs/day for 13 years    Types: Cigarettes    Quit date: 03/15/1976  . Smokeless tobacco: Never Used  . Alcohol Use: No  . Drug Use:  No  . Sexual Activity: Not on file   Other Topics Concern  . Not on file   Social History Narrative  . No narrative on file     Review of Systems  All other systems reviewed and are negative.       Objective:   Physical Exam  Vitals reviewed. Constitutional: He appears well-developed and well-nourished.  Neck: Neck supple. No JVD present. No thyromegaly present.  Cardiovascular: Normal rate, regular rhythm and normal heart sounds.   Pulmonary/Chest: Effort normal. He has wheezes. He has rales.  Abdominal: Soft. Bowel sounds are normal. He exhibits no distension. There is no tenderness. There is no rebound.  Lymphadenopathy:    He has no cervical adenopathy.          Assessment & Plan:  1. Acute bronchitis 2 acute asthmatic bronchitis with a Z-Pak, albuterol 2 puffs inhaled every 6 hours as needed, and Mucinex. If wheezing worsens we will need to add prednisone. - azithromycin (ZITHROMAX) 250 MG tablet; 2 tabs poqday1, 1 tab poqday 2-5  Dispense: 6 tablet; Refill: 0 - albuterol (PROVENTIL HFA;VENTOLIN HFA) 108 (90 BASE) MCG/ACT inhaler; Inhale 2 puffs into the lungs every 6 (six) hours as needed for wheezing or shortness of breath.  Dispense: 1 Inhaler; Refill: 0  2.  Personal history of colonic polyps Consult GI for repeat colonoscopy at [facility] - Ambulatory referral to Gastroenterology  3. CAD (coronary artery disease) Return fasting for CBC, CMP, and fasting lipid panel. His blood pressures well controlled today. - COMPLETE METABOLIC PANEL WITH GFR; Future - CBC with Differential; Future - Lipid panel; Future  4. Screening for prostate cancer Given his age the patient is also overdue for a PSA. - PSA; Future

## 2013-10-06 ENCOUNTER — Other Ambulatory Visit: Payer: Self-pay | Admitting: Cardiovascular Disease

## 2013-10-08 ENCOUNTER — Other Ambulatory Visit: Payer: Self-pay | Admitting: Cardiovascular Disease

## 2013-10-10 ENCOUNTER — Other Ambulatory Visit: Payer: Self-pay | Admitting: Cardiovascular Disease

## 2013-10-19 ENCOUNTER — Encounter: Payer: Self-pay | Admitting: Cardiovascular Disease

## 2013-10-19 ENCOUNTER — Ambulatory Visit (INDEPENDENT_AMBULATORY_CARE_PROVIDER_SITE_OTHER): Payer: BC Managed Care – PPO | Admitting: Cardiovascular Disease

## 2013-10-19 VITALS — BP 120/80 | HR 66 | Ht 69.0 in | Wt 230.6 lb

## 2013-10-19 DIAGNOSIS — E669 Obesity, unspecified: Secondary | ICD-10-CM

## 2013-10-19 DIAGNOSIS — G4733 Obstructive sleep apnea (adult) (pediatric): Secondary | ICD-10-CM

## 2013-10-19 DIAGNOSIS — R0602 Shortness of breath: Secondary | ICD-10-CM

## 2013-10-19 DIAGNOSIS — I10 Essential (primary) hypertension: Secondary | ICD-10-CM

## 2013-10-19 DIAGNOSIS — I2510 Atherosclerotic heart disease of native coronary artery without angina pectoris: Secondary | ICD-10-CM

## 2013-10-19 DIAGNOSIS — E785 Hyperlipidemia, unspecified: Secondary | ICD-10-CM

## 2013-10-19 DIAGNOSIS — R5381 Other malaise: Secondary | ICD-10-CM

## 2013-10-19 DIAGNOSIS — R5383 Other fatigue: Secondary | ICD-10-CM

## 2013-10-19 MED ORDER — RAMIPRIL 10 MG PO CAPS: 10.0000 mg | ORAL_CAPSULE | Freq: Every day | ORAL | Status: DC

## 2013-10-19 MED ORDER — ROSUVASTATIN CALCIUM 10 MG PO TABS: 10.0000 mg | ORAL_TABLET | Freq: Every day | ORAL | Status: DC

## 2013-10-19 MED ORDER — METOPROLOL TARTRATE 100 MG PO TABS: ORAL_TABLET | ORAL | Status: DC

## 2013-10-19 NOTE — Patient Instructions (Addendum)
Your physician has requested that you have an echocardiogram. Echocardiography is a painless test that uses sound waves to create images of your heart. It provides your doctor with information about the size and shape of your heart and how well your heart's chambers and valves are working. This procedure takes approximately one hour. There are no restrictions for this procedure.   Your physician recommends that you return for lab work.  Your physician recommends that you schedule a follow-up appointment in: 4 months. And

## 2013-10-19 NOTE — Progress Notes (Signed)
Patient ID: Don Aguilar, male   DOB: 08-08-1951, 63 y.o.   MRN: [id number]     HPI: Don Aguilar is a 63 y.o. male who presents to the office for an 18 month followup cardiology evaluation.  Mr. was have severe multivessel CAD in October 2010 and was referred for CABG revascularization surgery this was done by Dr. Lucianne Lei trite with the LIMA to the LAD, a vein to the first diagonal, vein to the circumflex marginal, and vein to the distal right cardiac artery. Additional problems include obesity, hypertension, as well as mixed hyperlipidemia. He also has a history of mild obstructive sleep apnea which was diagnosed 2 years ago. He did undergo a CPAP titration trial at 11 cm water pressure was recommended but he never followed up and had CPAP initiation. His AHI was 8.5 per hour and his RDI was 25.4 per hour. During REM sleep DHI was 10.8 and RDI 23.1.  Over the past year and a half, he does admit to shortness of breath with activity but denies any chest tightness. He has gained weight. He does note daytime sleepiness and snoring and wakes up at least 2 times per night while sleeping. He can easily take a nap during the day if the circumstances permit suggestive of hypersomnolence. He is unaware of palpitations. He is on Crestor for hyperlipidemia and is on metoprolol 150 milligrams twice a day and ramipril 10 mg daily. He presents for evaluation.  Past Medical History  Diagnosis Date  . Hypertension 2010  . GERD (gastroesophageal reflux disease)   . Hyperlipidemia   . Coronary artery disease     Past Surgical History  Procedure Laterality Date  . Hand tendon surgery  left hand, 1972  . Cardiac catheterization  06/08/2009    No intervention - recommend CABG  . Vascular evaluation  06/09/2009    No significant extracranial carotid artery stenosis demonstrated. Vertebrals are patent with antegrade flow.  . Coronary artery bypass graft  06/10/2009    x4. LIMA to LAD, SVG to first diag, SVG to  circumflex, SVG to RCA. Endoscopic vein harvest from right thigh and right lower leg.  . Cardiovascular stress test  05/30/2012    Small area of possible ischemia in basal septal wall. ECG negative for ischemia, but frequent PVCs continued from mid to peak exercise with  Bigeminy pattern.  Darden Dates without cardioversion  06/10/2009    EF 60-65%, Normal-trace findings    Allergies  Allergen Reactions  . Niaspan [Niacin Er] Itching    Lower legs    Current Outpatient Prescriptions  Medication Sig Dispense Refill  . albuterol (PROVENTIL HFA;VENTOLIN HFA) 108 (90 BASE) MCG/ACT inhaler Inhale 2 puffs into the lungs every 6 (six) hours as needed for wheezing or shortness of breath.  1 Inhaler  0  . aspirin 81 MG tablet Take 81 mg by mouth daily.        . metoprolol (LOPRESSOR) 100 MG tablet Take one & one-half tablets twice daily..  90 tablet  11  . omeprazole (PRILOSEC) 20 MG capsule Take 1 capsule (20 mg total) by mouth 2 (two) times daily.  60 capsule  5  . ramipril (ALTACE) 10 MG capsule Take 1 capsule (10 mg total) by mouth daily.  30 capsule  11  . rosuvastatin (CRESTOR) 10 MG tablet Take 1 tablet (10 mg total) by mouth daily.  30 tablet  11   No current facility-administered medications for this visit.    History   Social History  .  Marital Status: Married    Spouse Name: N/A    Number of Children: N/A  . Years of Education: N/A   Occupational History  . Not on file.   Social History Main Topics  . Smoking status: Former Smoker -- 2.00 packs/day for 13 years    Types: Cigarettes    Quit date: 03/15/1976  . Smokeless tobacco: Never Used  . Alcohol Use: No  . Drug Use: No  . Sexual Activity: Not on file   Other Topics Concern  . Not on file   Social History Narrative  . 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 He is married has 3 children 2 grandchildren.  Family History  Problem Relation Age of Onset  . Heart failure Mother   . Heart failure Father     . Cancer Sister     ROS is negative for fevers, chills or night sweats.   He denies skin rash. He denies change in vision or hearing. His unaware of lymphadenopathy. He admits to weight gain. He denies PND or orthopnea. He denies palpitations presyncope or syncope. There is no chest pressure. He does note shortness of breath with walking. He denies nausea vomiting or diarrhea. There is no blood in stool or urine. He denies claudication. He denies significant leg swelling. He does admit to being tired during the day and his sleep is nonrestorative. He does snore. There is no history of hypothyroidism were no diabetes. Other comprehensive 14 point system review is negative.  PE BP 120/80  Pulse 66  Ht 5' 9"  (1.753 m)  Wt 230 lb 9.6 oz (104.599 kg)  BMI 34.04 kg/m2  General: Alert, oriented, no distress moderate obesity Skin: normal turgor, no rashes HEENT: Normocephalic, atraumatic. Pupils round and reactive; sclera anicteric;no lid lag. Extraocular muscles intact;; no xanthelasmas. Nose without nasal septal hypertrophy Mouth/Parynx benign; Mallinpatti scale 3 Neck: No JVD, no carotid bruits; normal carotid upstroke Lungs: clear to ausculatation and percussion; no wheezing or rales Chest wall: no tenderness to palpitation Heart: RRR, s1 s2 normal;  1/6 systolic murmur,no diastolic murmur, rub thrills or heaves Abdomen: soft, nontender; no hepatosplenomehaly, BS+; abdominal aorta nontender and not dilated by palpation. Back: no CVA tenderness Pulses 2+ Extremities: no clubbing cyanosis or edema, Homan's sign negative  Neurologic: grossly nonfocal; cranial nerves grossly normal. Psychologic: normal affect and mood.  ECG (independently read by me): Normal sinus rhythm at 66 beats per minute. Nonspecific T. wave abnormality.  LABS:  BMET    Component Value Date/Time   NA 139 07/29/2009 1521   K 3.8 07/29/2009 1521   CL 107 07/29/2009 1521   CO2 26 07/29/2009 1521   GLUCOSE 98 07/29/2009  1521   BUN 12 07/29/2009 1521   CREATININE 1.08 07/29/2009 1521   CALCIUM 8.7 07/29/2009 1521   GFRNONAA >60 07/29/2009 1521   GFRAA  Value: >60        The eGFR has been calculated using the MDRD equation. This calculation has not been validated in all clinical situations. eGFR's persistently <60 mL/min signify possible Chronic Kidney Disease. 07/29/2009 1521     Hepatic Function Panel     Component Value Date/Time   PROT 6.8 07/29/2009 1521   ALBUMIN 3.7 07/29/2009 1521   AST 19 07/29/2009 1521   ALT 15 07/29/2009 1521   ALKPHOS 114 07/29/2009 1521   BILITOT 0.7 07/29/2009 1521     CBC    Component Value Date/Time   WBC 10.3 07/29/2009 1521  RBC 4.43 07/29/2009 1521   HGB 13.7 07/29/2009 1521   HCT 39.6 07/29/2009 1521   PLT 165 07/29/2009 1521   MCV 89.3 07/29/2009 1521   MCHC 34.5 07/29/2009 1521   RDW 12.6 07/29/2009 1521   LYMPHSABS 1.3 07/29/2009 1521   MONOABS 1.1* 07/29/2009 1521   EOSABS 0.3 07/29/2009 1521   BASOSABS 0.1 07/29/2009 1521     BNP No results found for this basename: probnp    Lipid Panel     Component Value Date/Time   CHOL  Value: 129        ATP III CLASSIFICATION:  <200     mg/dL   Desirable  200-239  mg/dL   Borderline High  >=240    mg/dL   High        06/10/2009 0336   TRIG 77 06/10/2009 0336   HDL 35* 06/10/2009 0336   CHOLHDL 3.7 06/10/2009 0336   VLDL 15 06/10/2009 0336   LDLCALC  Value: 79        Total Cholesterol/HDL:CHD Risk Coronary Heart Disease Risk Table                     Men   Women  1/2 Average Risk   3.4   3.3  Average Risk       5.0   4.4  2 X Average Risk   9.6   7.1  3 X Average Risk  23.4   11.0        Use the calculated Patient Ratio above and the CHD Risk Table to determine the patient's CHD Risk.        ATP III CLASSIFICATION (LDL):  <100     mg/dL   Optimal  100-129  mg/dL   Near or Above                    Optimal  130-159  mg/dL   Borderline  160-189  mg/dL   High  >190     mg/dL   Very High 06/10/2009 0336     RADIOLOGY: No  results found.    ASSESSMENT AND PLAN: Mr. Haven Foss is a 63 year old African American male who does have a history of moderate obesity in October 2010 was found to have severe multivessel CAD and he developed exertional chest pain symptomatology suggestive of ischemia. He underwent CABG surgery due to severe multivessel CAD. His last echo Doppler study was preoperatively. His last nuclear perfusion study was in October 2013 which revealed mild attenuation artifact. He did develop ectopy but his beta blocker was held for the past. We discussed his weight gain and the potential for 30 pound weight loss. He has no shortness of breath with activity. I am recommending an echo Doppler study be done to further evaluate systolic and diastolic function. I also again discussed his obstructive sleep apnea particularly with his cardiovascular comorbidities. With his weight gain, if he desires to try CPAP it may be worthwhile to consider a split night study or possibly initiate therapy if it can be initiated at this point with a CPAP auto unit. I'll see him in 4 months for followup evaluation.     Troy Sine, MD, [facility]  10/19/2013 5:11 PM

## 2013-10-26 ENCOUNTER — Other Ambulatory Visit: Payer: BC Managed Care – PPO

## 2013-10-26 DIAGNOSIS — I2510 Atherosclerotic heart disease of native coronary artery without angina pectoris: Secondary | ICD-10-CM

## 2013-10-26 DIAGNOSIS — Z125 Encounter for screening for malignant neoplasm of prostate: Secondary | ICD-10-CM

## 2013-10-26 LAB — CBC WITH DIFFERENTIAL/PLATELET
Basophils Absolute: 0 10*3/uL (ref 0.0–0.1)
Basophils Relative: 0 % (ref 0–1)
Eosinophils Absolute: 0.1 10*3/uL (ref 0.0–0.7)
Eosinophils Relative: 2 % (ref 0–5)
HCT: 42.9 % (ref 39.0–52.0)
Hemoglobin: 14.6 g/dL (ref 13.0–17.0)
Lymphocytes Relative: 35 % (ref 12–46)
Lymphs Abs: 2.6 10*3/uL (ref 0.7–4.0)
MCH: 29.8 pg (ref 26.0–34.0)
MCHC: 34 g/dL (ref 30.0–36.0)
MCV: 87.6 fL (ref 78.0–100.0)
Monocytes Absolute: 0.7 10*3/uL (ref 0.1–1.0)
Monocytes Relative: 9 % (ref 3–12)
Neutro Abs: 3.9 10*3/uL (ref 1.7–7.7)
Neutrophils Relative %: 54 % (ref 43–77)
Platelets: 160 10*3/uL (ref 150–400)
RBC: 4.9 MIL/uL (ref 4.22–5.81)
RDW: 13.8 % (ref 11.5–15.5)
WBC: 7.3 10*3/uL (ref 4.0–10.5)

## 2013-10-26 LAB — LIPID PANEL
Cholesterol: 159 mg/dL (ref 0–200)
HDL: 30 mg/dL — ABNORMAL LOW (ref >39)
LDL Cholesterol: 90 mg/dL (ref 0–99)
Total CHOL/HDL Ratio: 5.3 Ratio
Triglycerides: 193 mg/dL — ABNORMAL HIGH (ref <150)
VLDL: 39 mg/dL (ref 0–40)

## 2013-10-26 LAB — COMPLETE METABOLIC PANEL WITH GFR
ALT: 20 U/L (ref 0–53)
AST: 25 U/L (ref 0–37)
Albumin: 4.1 g/dL (ref 3.5–5.2)
Alkaline Phosphatase: 83 U/L (ref 39–117)
BUN: 22 mg/dL (ref 6–23)
CO2: 25 mEq/L (ref 19–32)
Calcium: 9.3 mg/dL (ref 8.4–10.5)
Chloride: 106 mEq/L (ref 96–112)
Creat: 1.15 mg/dL (ref 0.50–1.35)
GFR, Est African American: 78 mL/min
GFR, Est Non African American: 68 mL/min
Glucose, Bld: 91 mg/dL (ref 70–99)
Potassium: 4 mEq/L (ref 3.5–5.3)
Sodium: 142 mEq/L (ref 135–145)
Total Bilirubin: 0.5 mg/dL (ref 0.2–1.2)
Total Protein: 7 g/dL (ref 6.0–8.3)

## 2013-10-26 LAB — TSH: TSH: 2.087 u[IU]/mL (ref 0.350–4.500)

## 2013-10-26 LAB — PSA: PSA: 0.34 ng/mL (ref <=4.00)

## 2013-10-28 ENCOUNTER — Encounter: Payer: Self-pay | Admitting: Family Medicine

## 2013-11-03 ENCOUNTER — Ambulatory Visit (HOSPITAL_COMMUNITY)
Admission: RE | Admit: 2013-11-03 | Discharge: 2013-11-03 | Disposition: A | Discharge status: Home / Self Care | Payer: BC Managed Care – PPO | Source: Ambulatory Visit | Attending: Cardiovascular Disease | Admitting: Cardiovascular Disease

## 2013-11-03 DIAGNOSIS — I517 Cardiomegaly: Secondary | ICD-10-CM

## 2013-11-03 DIAGNOSIS — R0602 Shortness of breath: Secondary | ICD-10-CM

## 2013-11-03 NOTE — Progress Notes (Signed)
2D Echo Performed 11/03/2013    Candita Borenstein, RCS  

## 2014-01-21 ENCOUNTER — Emergency Department (HOSPITAL_COMMUNITY)
Admission: EM | Admit: 2014-01-21 | Discharge: 2014-01-21 | Disposition: A | Discharge status: Home / Self Care | Payer: BC Managed Care – PPO | Source: Home / Self Care | Attending: Emergency Medicine | Admitting: Emergency Medicine

## 2014-01-21 ENCOUNTER — Emergency Department (INDEPENDENT_AMBULATORY_CARE_PROVIDER_SITE_OTHER): Payer: BC Managed Care – PPO

## 2014-01-21 ENCOUNTER — Encounter (HOSPITAL_COMMUNITY): Payer: Self-pay | Admitting: Emergency Medicine

## 2014-01-21 ENCOUNTER — Telehealth: Payer: Self-pay | Admitting: *Deleted

## 2014-01-21 DIAGNOSIS — M719 Bursopathy, unspecified: Secondary | ICD-10-CM

## 2014-01-21 DIAGNOSIS — M67919 Unspecified disorder of synovium and tendon, unspecified shoulder: Secondary | ICD-10-CM

## 2014-01-21 DIAGNOSIS — H9319 Tinnitus, unspecified ear: Secondary | ICD-10-CM

## 2014-01-21 DIAGNOSIS — M7580 Other shoulder lesions, unspecified shoulder: Secondary | ICD-10-CM

## 2014-01-21 NOTE — Telephone Encounter (Signed)
Received call from patient wife.   States that patient is complaining of ringing in his ears, and soreness to head. States that patient is complaining of popping sensation in head.   Advised to have patient evaluated in ER.

## 2014-01-21 NOTE — ED Provider Notes (Signed)
Chief Complaint   Chief Complaint  Patient presents with  . Otalgia  . Shoulder Pain    History of Present Illness   Don Aguilar is a 63 year old male who has had a two-week history of a high pitched ringing in the left ear. He denies any ear pain, sensation of congestion, or vertigo. The pain is constant and is not very. It is nonpulsatile. He denies any injury to the ear. 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 His only pain is is some mild pain in the left temple for a couple weeks. He denies any nasal congestion, rhinorrhea, sore throat, or adenopathy.  His second complaint has been left shoulder pain for the past month. It hurts to abduct. He denies any injury. He has no pain on internal or external rotation or flexion. No numbness down the arm or tingling. No muscle weakness or swelling of the arm.  Review of Systems   Other than as noted above, the patient denies any of the following symptoms: Systemic:  No fevers or chills. Eye:  No redness, pain, discharge, itching, blurred vision, or diplopia. ENT:  No headache, nasal congestion, sneezing, itching, epistaxis, ear pain, decreased hearing, ringing in ears, vertigo, or tinnitus.  No oral lesions, sore throat, or hoarseness. Neck:  No neck pain or adenopathy. Skin:  No rash or itching.  PMFSH   Past medical history, family history, social history, meds, and allergies were reviewed. He's had a history of a coronary artery bypass graft, and has had hypertension and elevated cholesterol. Current meds include albuterol, aspirin, Lopressor, Prilosec, Altace, and Crestor.  Physical Examination     Vital signs:  BP 133/71  Pulse 62  Temp(Src) 98.4 F (36.9 C) (Oral)  Resp 12  SpO2 100% General:  Alert and oriented.  In no distress.  Skin warm and dry. Eye:  PERRL, full EOMs, lids and conjunctiva normal.   ENT:  TMs and canals clear.  Nasal mucosa not congested and without drainage.   Mucous membranes moist, no oral lesions, normal dentition, pharynx clear.  No cranial or facial pain to palplation. Hearing was normal to take a watch, fingers rub together, and was words. Neck:  Supple, full ROM.  No adenopathy, tenderness or mass.  Thyroid normal. Exam the shoulder reveals no pain to palpation and full range of motion with slight pain on abduction. Empty cans test was negative. Lungs:  Breath sounds clear and equal bilaterally.  No wheezes, rales or rhonchi. Heart:  Rhythm regular, without extrasystoles.  No gallops or murmers. Skin:  Clear, warm and dry.   Assessment   The primary encounter diagnosis was Tinnitus. A diagnosis of Rotator cuff tendinitis was also pertinent to this visit.  Tinnitus is probably on the basis of noise exposure and high frequency hearing loss.  His rotator cuff tendinitis is very minimal right now.  Plan    1.  Meds:  The following meds were prescribed:   Discharge Medication List as of 01/21/2014  6:24 PM      2.  Patient Education/Counseling:  The patient was given appropriate handouts, self care instructions, and instructed in symptomatic relief.  Given exercises for the shoulder. Told to use hearing protection or loud noises and avoid aspirin in large doses. I do not think that the 81 mg of aspirin is taking right now have any effect on his tinnitus.  3.  Follow up:  The patient was told to follow up here  if no better in 3 to 4 days, or sooner if becoming worse in any way, and given some red flag symptoms such as changes in hearing, ear pain, or new neurological symptoms which would prompt immediate return.  Followup with Dr. Christia Readingwight Bates for the hearing problem and Dr. Dorene GrebeScott Dean for a rotator cuff tendinitis.     Reuben Likesavid C Yeiden Frenkel, MD 01/21/14 [phone]

## 2014-01-21 NOTE — ED Notes (Signed)
C/o left ear pain States he has a pitch sound which is constantly for 2 weeks States he did stick a pencil inside his ear to help clean ear States ear does pop States there is a pain radiating up to his temple Denies any drainage  Left shoulder pain States shoulder will pop every now and then Sometimes he is unable to lift arm  No tx tried.

## 2014-01-21 NOTE — Discharge Instructions (Signed)
Most shoulder pain is caused by soft tissue problems rather than arthritis.  Rotator cuff tendonitis or tendonosis, rotator cuff tears, impingement syndrome and cartilege (labrum tears) are a few of the common causes of shoulder pain.  Fortunately, most of these can be treated with conservative measures as outlined below.  Do not do the following:  Doing any work with the arms above shoulder level (especially lifting) until the pain has subsided.  Sleeping on the affected side.  Especially avoid sleeping with your arm under your head or your pillow.  This is a habit that is hard to break.  Some people have to pin the arm of their pajamas to the chest area to prevent this.  Do the following:  Do the shoulder exercises below twice daily followed by ice for 10 minutes.  If no better in 1 month, follow up here, with your primary care doctor, or with an orthopedist.  Use of over the counter pain meds can be of help.  Tylenol (or acetaminophen) is the safest to use.  It often helps to take this regularly.  You can take up to 2 325 mg tablets 5 times daily, but it best to start out much lower that that, perhaps 2 325 mg tablets twice daily, then increase from there. People who are on the blood thinner warfarin have to be careful about taking high doses of Tylenol.  For people who are able to tolerate them, ibuprofen and Aleve can also help with the pain.  You should discuss these agents with your physician before taking them.  People with chronic kidney disease, hypertension, peptic ulcer disease, and reflux can suffer adverse side effects. They should not be taken with warfarin. The maximum dosage of ibuprofen is 800 mg 3 times daily with meals.  The maximum dosage of Aleve is 2 and 1/2 tablets twice daily with food, but again, start out low and gradually increase the dose until adequate pain relief is achieved. Ibuprofen and Aleve should always be taken with food.        Take ibuprofen 800 mg (4  tablets) every 8 hours with food as needed for shoulder pain.

## 2014-04-30 ENCOUNTER — Other Ambulatory Visit: Payer: Self-pay | Admitting: Family Medicine

## 2014-04-30 NOTE — Telephone Encounter (Signed)
Refill appropriate and filled per protocol. 

## 2014-08-22 ENCOUNTER — Emergency Department (HOSPITAL_COMMUNITY)
Admission: EM | Admit: 2014-08-22 | Discharge: 2014-08-23 | Disposition: A | Discharge status: Home / Self Care | Payer: BC Managed Care – PPO | Attending: Emergency Medicine | Admitting: Emergency Medicine

## 2014-08-22 ENCOUNTER — Emergency Department (HOSPITAL_COMMUNITY): Payer: BC Managed Care – PPO

## 2014-08-22 ENCOUNTER — Encounter (HOSPITAL_COMMUNITY): Payer: Self-pay | Admitting: Emergency Medicine

## 2014-08-22 DIAGNOSIS — K219 Gastro-esophageal reflux disease without esophagitis: Secondary | ICD-10-CM | POA: Insufficient documentation

## 2014-08-22 DIAGNOSIS — I10 Essential (primary) hypertension: Secondary | ICD-10-CM | POA: Diagnosis not present

## 2014-08-22 DIAGNOSIS — E785 Hyperlipidemia, unspecified: Secondary | ICD-10-CM | POA: Insufficient documentation

## 2014-08-22 DIAGNOSIS — Z7982 Long term (current) use of aspirin: Secondary | ICD-10-CM | POA: Insufficient documentation

## 2014-08-22 DIAGNOSIS — J40 Bronchitis, not specified as acute or chronic: Secondary | ICD-10-CM | POA: Diagnosis not present

## 2014-08-22 DIAGNOSIS — Z79899 Other long term (current) drug therapy: Secondary | ICD-10-CM | POA: Insufficient documentation

## 2014-08-22 DIAGNOSIS — I2510 Atherosclerotic heart disease of native coronary artery without angina pectoris: Secondary | ICD-10-CM | POA: Diagnosis not present

## 2014-08-22 DIAGNOSIS — R05 Cough: Secondary | ICD-10-CM | POA: Diagnosis present

## 2014-08-22 MED ORDER — PREDNISONE 20 MG PO TABS
60.0000 mg | ORAL_TABLET | Freq: Once | ORAL | Status: AC
  Administered 2014-08-22: 60 mg via ORAL
  Filled 2014-08-22: qty 3

## 2014-08-22 MED ORDER — IPRATROPIUM-ALBUTEROL 0.5-2.5 (3) MG/3ML IN SOLN
3.0000 mL | Freq: Once | RESPIRATORY_TRACT | Status: AC
  Administered 2014-08-22: 3 mL via RESPIRATORY_TRACT
  Filled 2014-08-22: qty 3

## 2014-08-22 NOTE — ED Notes (Signed)
Pt arrived via EMS on stretcher with c/o productive cough of white sputum x4 days. Resp even and unlabored. Pt reported difficulty lying down x4 days and episodes of SOB. Denies fever. Auscultated diminish breath sounds to RLL.

## 2014-08-22 NOTE — ED Notes (Signed)
Awake. Verbally responsive. A/O x4. Resp even and unlabored.Occ congested cough noted. ABC's intact.

## 2014-08-22 NOTE — ED Notes (Signed)
Bed: WA07 Expected date:  Expected time:  Means of arrival:  Comments: EMS 63yo M, shortness of breath, prod cough

## 2014-08-22 NOTE — ED Notes (Signed)
Awake. Verbally responsive. A/O x4. Resp even and unlabored. No audible adventitious breath sounds noted. ABC's intact.  

## 2014-08-22 NOTE — ED Provider Notes (Signed)
CSN: [id number]     Arrival date & time 08/22/14  2228 History   First MD Initiated Contact with Patient 08/22/14 2308     Chief Complaint  Patient presents with  . Cough     (Consider location/radiation/quality/duration/timing/severity/associated sxs/prior Treatment) HPI Don Aguilar is a 63 y.o. male with history of coronary disease, hypertension, hyperlipidemia, GERD, presents to emergency department with cough. Patient states he has had cold symptoms for about 2 weeks. States cold symptoms resolve, but cough persisted. Worsened in the last 3 days. States today he started coughing, was unable to stop. He states he was short of breath because he couldn't stop coughing, so they called EMS. [facility] arrived, coughing has stopped, but patient decided to come here for evaluation. He denies any chest pain, no chest pressure, no exertional symptoms. States when he stopped coughing, he coughed up a "stake ball of white sputum." States he felt better after that, but states he still has lots of mucus in his chest. He states he's been taking Mucinex for his symptoms at home.  Past Medical History  Diagnosis Date  . Hypertension 2010  . GERD (gastroesophageal reflux disease)   . Hyperlipidemia   . Coronary artery disease    Past Surgical History  Procedure Laterality Date  . Hand tendon surgery  left hand, 1972  . Cardiac catheterization  06/08/2009    No intervention - recommend CABG  . Vascular evaluation  06/09/2009    No significant extracranial carotid artery stenosis demonstrated. Vertebrals are patent with antegrade flow.  . Coronary artery bypass graft  06/10/2009    x4. LIMA to LAD, SVG to first diag, SVG to circumflex, SVG to RCA. Endoscopic vein harvest from right thigh and right lower leg.  . Cardiovascular stress test  05/30/2012    Small area of possible ischemia in basal septal wall. ECG negative for ischemia, but frequent PVCs continued from mid to peak exercise with  Bigeminy  pattern.  Rhae Hammock. Tee without cardioversion  06/10/2009    EF 60-65%, Normal-trace findings   Family History  Problem Relation Age of Onset  . Heart failure Mother   . Heart failure Father   . Cancer Sister    History  Substance Use Topics  . Smoking status: Former Smoker -- 2.00 packs/day for 13 years    Types: Cigarettes    Quit date: 03/15/1976  . Smokeless tobacco: Never Used  . Alcohol Use: No    Review of Systems  Constitutional: Negative for fever and chills.  HENT: Positive for congestion.   Respiratory: Positive for cough. Negative for chest tightness and shortness of breath.   Cardiovascular: Negative for chest pain, palpitations and leg swelling.  Gastrointestinal: Negative for nausea, vomiting, abdominal pain, diarrhea and abdominal distention.  Genitourinary: Negative for dysuria, urgency, frequency and hematuria.  Musculoskeletal: Negative for myalgias, arthralgias, neck pain and neck stiffness.  Skin: Negative for rash.  Allergic/Immunologic: Negative for immunocompromised state.  Neurological: Negative for dizziness, weakness, light-headedness, numbness and headaches.  All other systems reviewed and are negative.     Allergies  Niaspan  Home Medications   Prior to Admission medications   Medication Sig Start Date End Date Taking? Authorizing Provider  aspirin 81 MG tablet Take 81 mg by mouth daily.     Yes Historical Provider, MD  metoprolol (LOPRESSOR) 100 MG tablet Take one & one-half tablets twice daily.. Patient taking differently: Take 150 mg by mouth 2 (two) times daily.  10/19/13  Yes Lennette Biharihomas A Kelly,  MD  omeprazole (PRILOSEC) 20 MG capsule TAKE ONE CAPSULE BY MOUTH TWICE DAILY 04/30/14  Yes Donita Brooks, MD  ramipril (ALTACE) 10 MG capsule Take 1 capsule (10 mg total) by mouth daily. 10/19/13  Yes Lennette Bihari, MD  rosuvastatin (CRESTOR) 10 MG tablet Take 1 tablet (10 mg total) by mouth daily. 10/19/13  Yes Lennette Bihari, MD  albuterol (PROVENTIL  HFA;VENTOLIN HFA) 108 (90 BASE) MCG/ACT inhaler Inhale 2 puffs into the lungs every 6 (six) hours as needed for wheezing or shortness of breath. 10/02/13   Donita Brooks, MD   BP 126/72 mmHg  Pulse 66  Temp(Src) 98.5 F (36.9 C) (Oral)  Resp 18  Ht 5\' 9"  (1.753 m)  Wt 215 lb (97.523 kg)  BMI 31.74 kg/m2  SpO2 96% Physical Exam  Constitutional: He is oriented to person, place, and time. He appears well-developed and well-nourished. No distress.  HENT:  Head: Normocephalic and atraumatic.  Right Ear: External ear normal.  Left Ear: External ear normal.  Mouth/Throat: Oropharynx is clear and moist.  Clear rhinorrhea, pharynx erythemous, uvula midline  Eyes: Conjunctivae are normal.  Neck: Normal range of motion. Neck supple.  No meningeal signs  Cardiovascular: Normal rate, regular rhythm and normal heart sounds.   Pulmonary/Chest: Effort normal. No respiratory distress. He has wheezes. He has no rales.  Mild end expiratory wheezes  Abdominal: Soft. Bowel sounds are normal. There is no tenderness.  Musculoskeletal: He exhibits no edema or tenderness.  Lymphadenopathy:    He has no cervical adenopathy.  Neurological: He is alert and oriented to person, place, and time.  Skin: Skin is warm and dry. No erythema.  Psychiatric: He has a normal mood and affect.  Nursing note and vitals reviewed.   ED Course  Procedures (including critical care time) Labs Review Labs Reviewed - No data to display  Imaging Review Dg Chest 2 View (if Patient Has Fever And/or Copd)  08/22/2014   CLINICAL DATA:  Severe shortness of breath  EXAM: CHEST  2 VIEW  COMPARISON:  07/29/2009  FINDINGS: No cardiomegaly. Stable appearance of the mediastinum post CABG, including increased retrosternal density. There is no edema, consolidation, effusion, or pneumothorax.  IMPRESSION: No active cardiopulmonary disease.   Electronically Signed   By: Tiburcio Pea M.D.   On: 08/22/2014 23:23     EKG  Interpretation None      MDM   Final diagnoses:  Bronchitis   Pt with cough, episode of cough at home where he could not stop and became SOB. Ems was called. PT at this time comfortable appearing. Only intermittent productive cough in ED. Will get CXR, neb ordered, prednisone. Pt appears comfortable, no respiratory distress. No chest pain.   doubt cardiac symptoms. Patient clearly has cold symptoms with productive cough. Vital signs are normal.    Chest x-ray negative. Patient feels much better. We'll discharge home with antibiotics given he has been sick for 2 weeks, Z-Pak, both try Tessalon for cough. Inhaler provided, 2 puffs every 4 hours for the next 3 days then as needed. Prednisone for 4 more days. Follow-up with primary care doctor. Return precautions discussed.   Filed Vitals:   08/22/14 2231 08/22/14 2240 08/22/14 2346 08/23/14 0044  BP:  126/72  129/71  Pulse:  66  74  Temp:  98.5 F (36.9 C)    TempSrc:  Oral    Resp:  18  22  Height:  5\' 9"  (1.753 m)    Weight:  215 lb (97.523 kg)    SpO2: 98% 96% 97% 96%     Lottie Musselatyana A Alwyn Cordner, PA-C 08/23/14 0141  Raeford RazorStephen Kohut, MD 08/23/14 [phone]

## 2014-08-23 MED ORDER — AZITHROMYCIN 250 MG PO TABS: 250.0000 mg | ORAL_TABLET | Freq: Every day | ORAL | Status: DC

## 2014-08-23 MED ORDER — BENZONATATE 100 MG PO CAPS: 100.0000 mg | ORAL_CAPSULE | Freq: Three times a day (TID) | ORAL | Status: DC

## 2014-08-23 MED ORDER — ALBUTEROL SULFATE HFA 108 (90 BASE) MCG/ACT IN AERS
2.0000 | INHALATION_SPRAY | Freq: Once | RESPIRATORY_TRACT | Status: AC
  Administered 2014-08-23: 2 via RESPIRATORY_TRACT
  Filled 2014-08-23: qty 6.7

## 2014-08-23 MED ORDER — PREDNISONE 50 MG PO TABS: 50.0000 mg | ORAL_TABLET | Freq: Every day | ORAL | Status: DC

## 2014-08-23 NOTE — Discharge Instructions (Signed)
Your x-ray is normal today. Take inhaler, 2 puffs every 4 hrs for the next 3 days. Zithromax until all gone. Prednisone until all gone. Tessalon for cough as needed. Follow up with your primary care doctor. Return if worsening symptoms.    Acute Bronchitis Bronchitis is inflammation of the airways that extend from the windpipe into the lungs (bronchi). The inflammation often causes mucus to develop. This leads to a cough, which is the most common symptom of bronchitis.  In acute bronchitis, the condition usually develops suddenly and goes away over time, usually in a couple weeks. Smoking, allergies, and asthma can make bronchitis worse. Repeated episodes of bronchitis may cause further lung problems.  CAUSES Acute bronchitis is most often caused by the same virus that causes a cold. The virus can spread from person to person (contagious) through coughing, sneezing, and touching contaminated objects. SIGNS AND SYMPTOMS   Cough.   Fever.   Coughing up mucus.   Body aches.   Chest congestion.   Chills.   Shortness of breath.   Sore throat.  DIAGNOSIS  Acute bronchitis is usually diagnosed through a physical exam. Your health care provider will also ask you questions about your medical history. Tests, such as chest X-rays, are sometimes done to rule out other conditions.  TREATMENT  Acute bronchitis usually goes away in a couple weeks. Oftentimes, no medical treatment is necessary. Medicines are sometimes given for relief of fever or cough. Antibiotic medicines are usually not needed but may be prescribed in certain situations. In some cases, an inhaler may be recommended to help reduce shortness of breath and control the cough. A cool mist vaporizer may also be used to help thin bronchial secretions and make it easier to clear the chest.  HOME CARE INSTRUCTIONS  Get plenty of rest.   Drink enough fluids to keep your urine clear or pale yellow (unless you have a medical  condition that requires fluid restriction). Increasing fluids may help thin your respiratory secretions (sputum) and reduce chest congestion, and it will prevent dehydration.   Take medicines only as directed by your health care provider.  If you were prescribed an antibiotic medicine, finish it all even if you start to feel better.  Avoid smoking and secondhand smoke. Exposure to cigarette smoke or irritating chemicals will make bronchitis worse. If you are a smoker, consider using nicotine gum or skin patches to help control withdrawal symptoms. Quitting smoking will help your lungs heal faster.   Reduce the chances of another bout of acute bronchitis by washing your hands frequently, avoiding people with cold symptoms, and trying not to touch your hands to your mouth, nose, or eyes.   Keep all follow-up visits as directed by your health care provider.  SEEK MEDICAL CARE IF: Your symptoms do not improve after 1 week of treatment.  SEEK IMMEDIATE MEDICAL CARE IF:  You develop an increased fever or chills.   You have chest pain.   You have severe shortness of breath.  You have bloody sputum.   You develop dehydration.  You faint or repeatedly feel like you are going to pass out.  You develop repeated vomiting.  You develop a severe headache. MAKE SURE YOU:   Understand these instructions.  Will watch your condition.  Will get help right away if you are not doing well or get worse. Document Released: 09/20/2004 Document Revised: 12/28/2013 Document Reviewed: 02/03/2013 [facility] Patient Information 2015 [employer], [place]. This information is not intended to replace advice given  to you by your health care provider. Make sure you discuss any questions you have with your health care provider.

## 2014-08-23 NOTE — ED Notes (Signed)
Awake. Verbally responsive. A/O x4. Resp even and unlabored.Continues to have occ nonproductive congested cough noted. ABC's intact. NAD noted.

## 2014-11-06 ENCOUNTER — Other Ambulatory Visit: Payer: Self-pay | Admitting: Cardiovascular Disease

## 2014-11-08 NOTE — Telephone Encounter (Signed)
Rx has been sent to the pharmacy electronically. ° °

## 2014-11-09 ENCOUNTER — Other Ambulatory Visit: Payer: Self-pay | Admitting: Cardiovascular Disease

## 2014-11-09 NOTE — Telephone Encounter (Signed)
crestor refused - refilled 11/08/14

## 2014-11-20 ENCOUNTER — Other Ambulatory Visit: Payer: Self-pay | Admitting: Cardiovascular Disease

## 2014-11-22 NOTE — Telephone Encounter (Signed)
Rx(s) sent to pharmacy electronically.  

## 2014-12-03 ENCOUNTER — Other Ambulatory Visit: Payer: Self-pay | Admitting: Cardiovascular Disease

## 2014-12-04 ENCOUNTER — Other Ambulatory Visit: Payer: Self-pay | Admitting: Cardiovascular Disease

## 2014-12-23 ENCOUNTER — Other Ambulatory Visit: Payer: Self-pay | Admitting: Cardiovascular Disease

## 2014-12-24 ENCOUNTER — Other Ambulatory Visit: Payer: Self-pay | Admitting: Cardiovascular Disease

## 2014-12-24 NOTE — Telephone Encounter (Signed)
Rx(s) sent to pharmacy electronically.  

## 2014-12-25 ENCOUNTER — Other Ambulatory Visit: Payer: Self-pay | Admitting: Cardiovascular Disease

## 2014-12-30 ENCOUNTER — Ambulatory Visit (INDEPENDENT_AMBULATORY_CARE_PROVIDER_SITE_OTHER): Payer: BC Managed Care – PPO | Admitting: Family Medicine

## 2014-12-30 ENCOUNTER — Encounter: Payer: Self-pay | Admitting: Family Medicine

## 2014-12-30 VITALS — BP 100/68 | HR 68 | Temp 98.0°F | Resp 16 | Wt 227.0 lb

## 2014-12-30 DIAGNOSIS — I10 Essential (primary) hypertension: Secondary | ICD-10-CM

## 2014-12-30 DIAGNOSIS — E785 Hyperlipidemia, unspecified: Secondary | ICD-10-CM

## 2014-12-30 DIAGNOSIS — I2510 Atherosclerotic heart disease of native coronary artery without angina pectoris: Secondary | ICD-10-CM

## 2014-12-30 DIAGNOSIS — Z125 Encounter for screening for malignant neoplasm of prostate: Secondary | ICD-10-CM | POA: Diagnosis not present

## 2014-12-30 MED ORDER — ROSUVASTATIN CALCIUM 10 MG PO TABS: 10.0000 mg | ORAL_TABLET | Freq: Every day | ORAL | Status: DC

## 2014-12-30 MED ORDER — METOPROLOL TARTRATE 100 MG PO TABS: 150.0000 mg | ORAL_TABLET | Freq: Two times a day (BID) | ORAL | Status: DC

## 2014-12-30 MED ORDER — RAMIPRIL 10 MG PO CAPS: ORAL_CAPSULE | ORAL | Status: DC

## 2014-12-30 NOTE — Progress Notes (Signed)
Subjective:    Patient ID: Marni GriffonClayton Pembroke, male    DOB: 03-25-1951, 64 y.o.   MRN: [id number]  HPI Patient has a significant history of coronary artery disease status post coronary artery bypass grafting 4. I have not seen the patient in over a year. I have no fasting lab work to review. He has not seen his cardiologist in over a year. Recently he was not given a full supply of medication and was instructed to follow with his cardiologist before any further refills. Patient made an appointment here hoping to get refills on his medication. I believe he was confused. He denies any chest pain. He denies any angina. He denies any shortness of breath. He does have some mild dyspnea on exertion going up 3 flights of steps. He denies any claudication. He is taking his metoprolol, his REM a pill, and his aspirin every day. He has been out of Crestor for over 2 weeks. He denies any myalgias or right upper quadrant pain on Crestor. Patient had a colonoscopy at an outside hospital in 2015. He is overdue for a prostate exam Past Medical History  Diagnosis Date  . Hypertension 2010  . GERD (gastroesophageal reflux disease)   . Hyperlipidemia   . Coronary artery disease    Past Surgical History  Procedure Laterality Date  . Hand tendon surgery  left hand, 1972  . Cardiac catheterization  06/08/2009    No intervention - recommend CABG  . Vascular evaluation  06/09/2009    No significant extracranial carotid artery stenosis demonstrated. Vertebrals are patent with antegrade flow.  . Coronary artery bypass graft  06/10/2009    x4. LIMA to LAD, SVG to first diag, SVG to circumflex, SVG to RCA. Endoscopic vein harvest from right thigh and right lower leg.  . Cardiovascular stress test  05/30/2012    Small area of possible ischemia in basal septal wall. ECG negative for ischemia, but frequent PVCs continued from mid to peak exercise with  Bigeminy pattern.  Rhae Hammock. Tee without cardioversion  06/10/2009    EF 60-65%,  Normal-trace findings   Medication list is reviewed and is accurate Allergies  Allergen Reactions  . Niaspan [Niacin Er] Itching    Lower legs   History   Social History  . Marital Status: Married    Spouse Name: N/A  . Number of Children: N/A  . Years of Education: N/A   Occupational History  . Not on file.   Social History Main Topics  . Smoking status: Former Smoker -- 2.00 packs/day for 13 years    Types: Cigarettes    Quit date: 03/15/1976  . Smokeless tobacco: Never Used  . Alcohol Use: No  . Drug Use: No  . Sexual Activity: Not on file   Other Topics Concern  . Not on file   Social History Narrative      Review of Systems  All other systems reviewed and are negative.      Objective:   Physical Exam  Constitutional: He appears well-developed and well-nourished.  Neck: Neck supple. No thyromegaly present.  Cardiovascular: Normal rate, regular rhythm and normal heart sounds.   No murmur heard. Pulses:      Carotid pulses are 2+ on the right side, and 2+ on the left side.      Femoral pulses are 2+ on the right side, and 2+ on the left side.      Popliteal pulses are 2+ on the right side, and 2+ on the left  side.       Dorsalis pedis pulses are 1+ on the right side, and 1+ on the left side.       Posterior tibial pulses are 1+ on the right side, and 1+ on the left side.  Pulmonary/Chest: Effort normal and breath sounds normal. No respiratory distress. He has no wheezes. He has no rales.  Abdominal: Soft. Bowel sounds are normal. He exhibits no distension. There is no tenderness. There is no rebound and no guarding.  Musculoskeletal: He exhibits no edema.  Lymphadenopathy:    He has no cervical adenopathy.  Vitals reviewed.         Assessment & Plan:  ASCVD (arteriosclerotic cardiovascular disease) - Plan: rosuvastatin (CRESTOR) 10 MG tablet, ramipril (ALTACE) 10 MG capsule, metoprolol (LOPRESSOR) 100 MG tablet, CBC with Differential/Platelet,  COMPLETE METABOLIC PANEL WITH GFR, Lipid panel  Benign essential HTN  HLD (hyperlipidemia)  Prostate cancer screening - Plan: PSA  Patient's exam is normal today. He is compliant with his aspirin, beta blocker, and his ACE inhibitor. His blood pressure is excellent. He denies any symptoms of unstable angina or typical angina. He has been off his cholesterol medication for over 2 weeks. I have begged the patient come in fasting so that I can check a CBC, CMP, fasting lipid panel. While in checking his blood work, I would also like to obtain a PSA. I only gave the patient 2 months with the medication to allow him time to schedule a follow-up appointment with his cardiologist. This will also give him time to come in fasting for lab work. If he does not come in fasting for lab work I will not refill his medication. I strongly encouraged him to schedule a follow-up appointment with his cardiologist, Dr. Tresa EndoKelly.

## 2015-03-05 ENCOUNTER — Other Ambulatory Visit: Payer: Self-pay | Admitting: Family Medicine

## 2015-04-05 ENCOUNTER — Other Ambulatory Visit: Payer: Self-pay | Admitting: Cardiovascular Disease

## 2015-04-05 NOTE — Telephone Encounter (Signed)
Rx(s) sent to pharmacy electronically.  

## 2015-04-09 ENCOUNTER — Telehealth: Payer: Self-pay | Admitting: Cardiology

## 2015-04-09 ENCOUNTER — Other Ambulatory Visit: Payer: Self-pay | Admitting: Cardiology

## 2015-04-09 DIAGNOSIS — I2510 Atherosclerotic heart disease of native coronary artery without angina pectoris: Secondary | ICD-10-CM

## 2015-04-09 MED ORDER — ROSUVASTATIN CALCIUM 10 MG PO TABS: 10.0000 mg | ORAL_TABLET | Freq: Every day | ORAL | Status: DC

## 2015-04-09 MED ORDER — METOPROLOL TARTRATE 100 MG PO TABS: 150.0000 mg | ORAL_TABLET | Freq: Two times a day (BID) | ORAL | Status: DC

## 2015-04-09 MED ORDER — RAMIPRIL 10 MG PO CAPS: 10.0000 mg | ORAL_CAPSULE | Freq: Every day | ORAL | Status: DC

## 2015-04-09 NOTE — Telephone Encounter (Signed)
Pt's wife called, pt is out of his medications and pharmacy would not refill- LOV Feb 2015. I refilled Lopressor, Altace, and Crestor for one month. He needs to be seen as an OP, I have contacted the office.  Corine Shelter PA-C 04/09/2015 12:16 PM

## 2015-05-06 ENCOUNTER — Telehealth: Payer: Self-pay | Admitting: Cardiovascular Disease

## 2015-05-06 DIAGNOSIS — I2510 Atherosclerotic heart disease of native coronary artery without angina pectoris: Secondary | ICD-10-CM

## 2015-05-06 MED ORDER — RAMIPRIL 10 MG PO CAPS: 10.0000 mg | ORAL_CAPSULE | Freq: Every day | ORAL | Status: DC

## 2015-05-06 MED ORDER — ROSUVASTATIN CALCIUM 10 MG PO TABS: 10.0000 mg | ORAL_TABLET | Freq: Every day | ORAL | Status: DC

## 2015-05-06 MED ORDER — METOPROLOL TARTRATE 100 MG PO TABS: 150.0000 mg | ORAL_TABLET | Freq: Two times a day (BID) | ORAL | Status: DC

## 2015-05-06 NOTE — Telephone Encounter (Signed)
°  1. Which medications need to be refilled? Metoprolol,Generic CrestorlPt can not get an appt with Dr Tresa Endo until [place]. Which pharmacy is medication to be sent to?[phone]  3. Do they need a 30 day or 90 day supply?90   4. Would they like a call back once the medication has been sent to the pharmacy?yes

## 2015-05-06 NOTE — Telephone Encounter (Signed)
Rx(s) sent to pharmacy electronically.  Patient aware of refills  

## 2015-05-23 ENCOUNTER — Encounter: Payer: Self-pay | Admitting: Family Medicine

## 2015-05-23 ENCOUNTER — Other Ambulatory Visit: Payer: Self-pay | Admitting: Family Medicine

## 2015-05-23 NOTE — Telephone Encounter (Signed)
Told by provider 12/30/14 to return for fasting lab work and see his cardiologist or we will not refill his medications.  Pt has not done either.  Med refill denied and letter sent to have him come for lab work.

## 2015-05-28 ENCOUNTER — Other Ambulatory Visit: Payer: Self-pay | Admitting: Family Medicine

## 2015-06-02 ENCOUNTER — Other Ambulatory Visit: Payer: BC Managed Care – PPO

## 2015-06-02 LAB — CBC WITH DIFFERENTIAL/PLATELET
Basophils Absolute: 0 10*3/uL (ref 0.0–0.1)
Basophils Relative: 0 % (ref 0–1)
Eosinophils Absolute: 0.2 10*3/uL (ref 0.0–0.7)
Eosinophils Relative: 2 % (ref 0–5)
HCT: 41.8 % (ref 39.0–52.0)
Hemoglobin: 13.9 g/dL (ref 13.0–17.0)
Lymphocytes Relative: 33 % (ref 12–46)
Lymphs Abs: 2.7 10*3/uL (ref 0.7–4.0)
MCH: 30.3 pg (ref 26.0–34.0)
MCHC: 33.3 g/dL (ref 30.0–36.0)
MCV: 91.1 fL (ref 78.0–100.0)
MPV: 12 fL (ref 8.6–12.4)
Monocytes Absolute: 0.9 10*3/uL (ref 0.1–1.0)
Monocytes Relative: 11 % (ref 3–12)
Neutro Abs: 4.5 10*3/uL (ref 1.7–7.7)
Neutrophils Relative %: 54 % (ref 43–77)
Platelets: 182 10*3/uL (ref 150–400)
RBC: 4.59 MIL/uL (ref 4.22–5.81)
RDW: 13.4 % (ref 11.5–15.5)
WBC: 8.3 10*3/uL (ref 4.0–10.5)

## 2015-06-02 LAB — COMPLETE METABOLIC PANEL WITH GFR
ALT: 16 U/L (ref 9–46)
AST: 20 U/L (ref 10–35)
Albumin: 3.9 g/dL (ref 3.6–5.1)
Alkaline Phosphatase: 84 U/L (ref 40–115)
BUN: 20 mg/dL (ref 7–25)
CO2: 28 mmol/L (ref 20–31)
Calcium: 8.8 mg/dL (ref 8.6–10.3)
Chloride: 102 mmol/L (ref 98–110)
Creat: 1.06 mg/dL (ref 0.70–1.25)
GFR, Est African American: 86 mL/min (ref >=60)
GFR, Est Non African American: 74 mL/min (ref >=60)
Glucose, Bld: 74 mg/dL (ref 70–99)
Potassium: 4.3 mmol/L (ref 3.5–5.3)
Sodium: 137 mmol/L (ref 135–146)
Total Bilirubin: 0.3 mg/dL (ref 0.2–1.2)
Total Protein: 6.4 g/dL (ref 6.1–8.1)

## 2015-06-02 LAB — LIPID PANEL
Cholesterol: 112 mg/dL — ABNORMAL LOW (ref 125–200)
HDL: 23 mg/dL — ABNORMAL LOW (ref >=40)
LDL Cholesterol: 62 mg/dL (ref <130)
Total CHOL/HDL Ratio: 4.9 Ratio (ref <=5.0)
Triglycerides: 135 mg/dL (ref <150)
VLDL: 27 mg/dL (ref <30)

## 2015-06-03 LAB — PSA: PSA: 0.56 ng/mL (ref <=4.00)

## 2015-06-28 ENCOUNTER — Encounter: Payer: Self-pay | Admitting: Cardiovascular Disease

## 2015-06-28 ENCOUNTER — Ambulatory Visit (INDEPENDENT_AMBULATORY_CARE_PROVIDER_SITE_OTHER): Payer: BC Managed Care – PPO | Admitting: Cardiovascular Disease

## 2015-06-28 VITALS — BP 118/80 | HR 52 | Ht 69.0 in | Wt 226.5 lb

## 2015-06-28 DIAGNOSIS — I10 Essential (primary) hypertension: Secondary | ICD-10-CM

## 2015-06-28 DIAGNOSIS — E785 Hyperlipidemia, unspecified: Secondary | ICD-10-CM

## 2015-06-28 DIAGNOSIS — R0602 Shortness of breath: Secondary | ICD-10-CM

## 2015-06-28 DIAGNOSIS — E66811 Obesity, class 1: Secondary | ICD-10-CM

## 2015-06-28 DIAGNOSIS — R0600 Dyspnea, unspecified: Secondary | ICD-10-CM | POA: Insufficient documentation

## 2015-06-28 DIAGNOSIS — R0609 Other forms of dyspnea: Secondary | ICD-10-CM

## 2015-06-28 DIAGNOSIS — E669 Obesity, unspecified: Secondary | ICD-10-CM

## 2015-06-28 DIAGNOSIS — I2510 Atherosclerotic heart disease of native coronary artery without angina pectoris: Secondary | ICD-10-CM | POA: Diagnosis not present

## 2015-06-28 DIAGNOSIS — G4733 Obstructive sleep apnea (adult) (pediatric): Secondary | ICD-10-CM

## 2015-06-28 NOTE — Patient Instructions (Signed)
Your physician has requested that you have en exercise stress myoview. For further information please visit [email or web address]. Please follow instruction sheet, as given.   Your physician wants you to follow-up in: 1 YEAR OR SOONER IF NEEDED. You will receive a reminder letter in the mail two months in advance. If you don't receive a letter, please call our office to schedule the follow-up appointment.   If you need a refill on your cardiac medications before your next appointment, please call your pharmacy.

## 2015-06-28 NOTE — Progress Notes (Signed)
Patient ID: Don Aguilar, male   DOB: 05-14-1951, 64 y.o.   MRN: [id number]     HPI: Don Aguilar is a 64 y.o. male who presents to the office for an 21 month followup cardiology evaluation.  Don Aguilar was found to have severe multivessel CAD in October 2010.  He underwent CABG revascularization surgery by Dr. Nils Pyle with the LIMA to the LAD, a vein to the first diagonal, vein to the circumflex marginal, and vein to the distal RCA.  Additional problems include obesity, hypertension, as well as mixed hyperlipidemia.  He also has a history of mild obstructive sleep apnea which was diagnosed 2 years ago. He did undergo a CPAP titration trial at 11 cm water pressure was recommended but he never followed up and had CPAP initiation. His AHI was 8.5 per hour and his RDI was 25.4 per hour. During REM sleep AHI was 10.8 and RDI 23.1.  Since I last saw him in February 2015.  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  He denies any episodes of chest pain.  However, he does admit to progressive shortness of breath particularly with walking up steps.  This has progressed over the past 6 months.  He denies any presyncope.  He denies palpitations.  He denies significant hypersomnolence.  He typically goes to bed at 10 PM and wakes up at 4:30 AM.  He does snore.  He presents for evaluation.   Past Medical History  Diagnosis Date  . Hypertension 2010  . GERD (gastroesophageal reflux disease)   . Hyperlipidemia   . Coronary artery disease     Past Surgical History  Procedure Laterality Date  . Hand tendon surgery  left hand, 1972  . Cardiac catheterization  06/08/2009    No intervention - recommend CABG  . Vascular evaluation  06/09/2009    No significant extracranial carotid artery stenosis demonstrated. Vertebrals are patent with antegrade flow.  . Coronary artery bypass graft  06/10/2009    x4. LIMA to LAD, SVG to first diag, SVG to circumflex, SVG to RCA. Endoscopic  vein harvest from right thigh and right lower leg.  . Cardiovascular stress test  05/30/2012    Small area of possible ischemia in basal septal wall. ECG negative for ischemia, but frequent PVCs continued from mid to peak exercise with  Bigeminy pattern.  Darden Dates without cardioversion  06/10/2009    EF 60-65%, Normal-trace findings    Allergies  Allergen Reactions  . Niaspan [Niacin Er] Itching    Lower legs    Current Outpatient Prescriptions  Medication Sig Dispense Refill  . aspirin 81 MG tablet Take 81 mg by mouth daily.      . metoprolol (LOPRESSOR) 100 MG tablet Take 1.5 tablets (150 mg total) by mouth 2 (two) times daily. 270 tablet 0  . omeprazole (PRILOSEC) 20 MG capsule TAKE ONE CAPSULE BY MOUTH TWICE DAILY 60 capsule 11  . ramipril (ALTACE) 10 MG capsule Take 1 capsule (10 mg total) by mouth daily. 90 capsule 0  . rosuvastatin (CRESTOR) 10 MG tablet Take 1 tablet (10 mg total) by mouth daily. 90 tablet 0   No current facility-administered medications for this visit.    Social History   Social History  . Marital Status: Married    Spouse Name: N/A  . Number of Children: N/A  . Years of Education: N/A   Occupational History  . Not on file.   Social History Main Topics  .  Smoking status: Former Smoker -- 2.00 packs/day for 13 years    Types: Cigarettes    Quit date: 03/15/1976  . Smokeless tobacco: Never Used  . Alcohol Use: No  . Drug Use: No  . Sexual Activity: Not on file   Other Topics Concern  . 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  Family History  Problem Relation Age of Onset  . Heart failure Mother   . Heart failure Father   . Cancer Sister    ROS General: Negative; No fevers, chills, or night sweats;  HEENT: Negative; No changes in vision or hearing, sinus congestion, difficulty  swallowing Pulmonary: Negative; No cough, wheezing, shortness of breath, hemoptysis Cardiovascular: Negative; No chest pain, presyncope, syncope, palpitations GI: Negative; No nausea, vomiting, diarrhea, or abdominal pain GU: Negative; No dysuria, hematuria, or difficulty voiding Musculoskeletal: Negative; no myalgias, joint pain, or weakness Hematologic/Oncology: Negative; no easy bruising, bleeding Endocrine: Negative; no heat/cold intolerance; no diabetes Neuro: Negative; no changes in balance, headaches Skin: Negative; No rashes or skin lesions Psychiatric: Negative; No behavioral problems, depression Sleep: Positive for mild sleep apnea; No snoring, daytime sleepiness, hypersomnolence, bruxism, restless legs, hypnogognic hallucinations, no cataplexy Other comprehensive 14 point system review is negative.  PE BP 118/80 mmHg  Pulse 52  Ht 5' 9"  (1.753 m)  Wt 226 lb 8 oz (102.74 kg)  BMI 33.43 kg/m2   Repeat blood pressure 130/70  Wt Readings from Last 3 Encounters:  06/28/15 226 lb 8 oz (102.74 kg)  12/30/14 227 lb (102.967 kg)  08/22/14 215 lb (97.523 kg)   General: Alert, oriented, no distress moderate obesity Skin: normal turgor, no rashes HEENT: Normocephalic, atraumatic. Pupils round and reactive; sclera anicteric;no lid lag. Extraocular muscles intact;; no xanthelasmas. Nose without nasal septal hypertrophy Mouth/Parynx benign; Mallinpatti scale 3 Neck: No JVD, no carotid bruits; normal carotid upstroke Lungs: clear to ausculatation and percussion; no wheezing or rales Chest wall: no tenderness to palpitation Heart: RRR, s1 s2 normal;  1/6 systolic murmur,no diastolic murmur, rub thrills or heaves Abdomen: soft, nontender; no hepatosplenomehaly, BS+; abdominal aorta nontender and not dilated by palpation. Back: no CVA tenderness Pulses 2+ Extremities: no clubbing cyanosis or edema, Homan's sign negative  Neurologic: grossly nonfocal; cranial nerves grossly  normal. Psychologic: normal affect and mood.  ECG (independently read by me): Sinus bradycardia 52 bpm.  Previously noted T-wave abnormality inferolaterally  February 2015 ECG (independently read by me): Normal sinus rhythm at 66 beats per minute. Nonspecific T. wave abnormality.  LABS:  BMP Latest Ref Rng 06/02/2015 10/26/2013 07/29/2009  Glucose 70 - 99 mg/dL 74 91 98  BUN 7 - 25 mg/dL 20 22 12   Creatinine 0.70 - 1.25 mg/dL 1.06 1.15 1.08  Sodium 135 - 146 mmol/L 137 142 139  Potassium 3.5 - 5.3 mmol/L 4.3 4.0 3.8  Chloride 98 - 110 mmol/L 102 106 107  CO2 20 - 31 mmol/L 28 25 26   Calcium 8.6 - 10.3 mg/dL 8.8 9.3 8.7   Hepatic Function Latest Ref Rng 06/02/2015 10/26/2013 07/29/2009  Total Protein 6.1 - 8.1 g/dL 6.4 7.0 6.8  Albumin 3.6 - 5.1 g/dL 3.9 4.1 3.7  AST 10 - 35 U/L 20 25 19   ALT 9 - 46 U/L 16 20 15   Alk Phosphatase 40 - 115 U/L 84 83 114  Total Bilirubin 0.2 - 1.2  mg/dL 0.3 0.5 0.7   CBC Latest Ref Rng 06/02/2015 10/26/2013 07/29/2009  WBC 4.0 - 10.5 K/uL 8.3 7.3 10.3  Hemoglobin 13.0 - 17.0 g/dL 13.9 14.6 13.7  Hematocrit 39.0 - 52.0 % 41.8 42.9 39.6  Platelets 150 - 400 K/uL 182 160 165   Lab Results  Component Value Date   MCV 91.1 06/02/2015   MCV 87.6 10/26/2013   MCV 89.3 07/29/2009   Lab Results  Component Value Date   TSH 2.087 10/26/2013   Lab Results  Component Value Date   HGBA1C  06/10/2009    5.9 (NOTE) The ADA recommends the following therapeutic goal for glycemic control related to Hgb A1c measurement: Goal of therapy: <6.5 Hgb A1c  Reference: American Diabetes Association: Clinical Practice Recommendations 2010, Diabetes Care, 2010, 33: (Suppl  1).   Lipid Panel     Component Value Date/Time   CHOL 112* 06/02/2015 0941   TRIG 135 06/02/2015 0941   HDL 23* 06/02/2015 0941   CHOLHDL 4.9 06/02/2015 0941   VLDL 27 06/02/2015 0941   LDLCALC 62 06/02/2015 0941   RADIOLOGY: No results found.    ASSESSMENT AND PLAN: Don Aguilar is a  64 year old African American male who has a history of moderate obesity and in October 2010 was found to have severe multivessel CAD after he had developed exertional chest pain symptomatology suggestive of ischemia. He underwent CABG surgery due to severe multivessel CAD.  His last nuclear perfusion study was in October 2013 which revealed mild attenuation artifact. He did develop ectopy but his beta blocker was held for this study.  Recently, he denies recurrent chest pain symptomatology, but is definitely noticed a decline in his ability particularly to walk up hills and and climb steps with development of exertional dyspnea.  Since it is 6 years since his CABG surgery in over 3 years since his last nuclear study.  I am recommending that he undergo an exercise nuclear study for further evaluation.  He is on high-dose beta blocker therapy with metoprolol 150 twice a day.  He will hold this for the day prior to the study in the morning of the study in attempt to increase his heart rate with exercise.  Reviewed his recent blood work which was done by Dr. Dennard Schaumann several weeks ago.  His lipids status is excellent on his current dose of Crestor 10 mg.  His blood pressure is controlled.  He does have GERD but this is controlled with omeprazole.  He has sleep apnea but seems to be sleeping well.   I also discussed the importance of weight loss and increased aerobic capacity. I will contact him regarding his nuclear study and as long as this remains unchanged, I will see him in one year for follow-up evaluation.  However, if the study suggests  new abnormality further evaluation will be warranted.   Time spent: 25 minutes Troy Sine, MD, [facility]  06/28/2015 4:26 PM

## 2015-07-01 ENCOUNTER — Telehealth (HOSPITAL_COMMUNITY): Payer: Self-pay

## 2015-07-01 NOTE — Telephone Encounter (Signed)
Encounter complete. 

## 2015-07-04 ENCOUNTER — Telehealth (HOSPITAL_COMMUNITY): Payer: Self-pay | Admitting: *Deleted

## 2015-07-05 ENCOUNTER — Telehealth (HOSPITAL_COMMUNITY): Payer: Self-pay | Admitting: *Deleted

## 2015-07-06 ENCOUNTER — Inpatient Hospital Stay (HOSPITAL_COMMUNITY): Admission: RE | Admit: 2015-07-06 | Payer: BC Managed Care – PPO | Source: Ambulatory Visit

## 2015-08-03 ENCOUNTER — Other Ambulatory Visit: Payer: Self-pay | Admitting: Cardiovascular Disease

## 2015-08-03 NOTE — Telephone Encounter (Signed)
REFILL 

## 2015-12-06 ENCOUNTER — Other Ambulatory Visit: Payer: Self-pay | Admitting: Cardiology

## 2015-12-06 NOTE — Telephone Encounter (Signed)
REFILL 

## 2016-02-20 ENCOUNTER — Ambulatory Visit (INDEPENDENT_AMBULATORY_CARE_PROVIDER_SITE_OTHER): Payer: BC Managed Care – PPO | Admitting: Physician Assistant

## 2016-02-20 ENCOUNTER — Encounter: Payer: Self-pay | Admitting: Physician Assistant

## 2016-02-20 VITALS — BP 114/62 | HR 84 | Temp 98.6°F | Resp 16 | Ht 69.0 in | Wt 229.0 lb

## 2016-02-20 DIAGNOSIS — R319 Hematuria, unspecified: Secondary | ICD-10-CM | POA: Diagnosis not present

## 2016-02-20 LAB — URINALYSIS, ROUTINE W REFLEX MICROSCOPIC
Bilirubin Urine: NEGATIVE
Glucose, UA: NEGATIVE
Hgb urine dipstick: NEGATIVE
Leukocytes, UA: NEGATIVE
Nitrite: NEGATIVE
Protein, ur: NEGATIVE
Specific Gravity, Urine: 1.025 (ref 1.001–1.035)
pH: 6 (ref 5.0–8.0)

## 2016-02-20 NOTE — Progress Notes (Signed)
Patient ID: Don Aguilar MRN: [id number], DOB: 1950-12-27, 65 y.o. Date of Encounter: @DATE @  Chief Complaint:  Chief Complaint  Patient presents with  . Hematuria    Pt noticed Sunday morning, has not noticed since    HPI: 65 y.o. year old male  presents with above.   Says that Sunday morning 02/20/16---he got up from bed, went to BR to urinate, then returned to bed.   When he later got back up from bed, noticed 3 drops of blood on floor---one near toilet, one midway between toilet and his bed, and one near his bed.  He then looked at his underwear and saw blood on front left of underwear---he had just showered and put on new underwear the night before.   He thinks he had not turned on the light when he went to urinate.  He had flushed the toilet when he urinated.  Could not check toilet for blood in toilet---there was no blood in toilet (but thinks he had flushed)  Says the next time he urinated on Sunday, urine appeared normal. Saw no blood in urine, no pink color to urine. Had no dysuria.  Does not recall having any pain the 1 -2 days prior to this --no pain in areas where can have pain secondary to kidney stone--no pain at kidney region, area of ureter, bladder, or even at urethra..   He does remember that when he got up early Sunday morning and went to urinate, remembers he stood there for 1 -2 seconds, couldn't urinate, then got relief. Wondering if may have had small kidney stone.  No other complaint/concern.   Past Medical History  Diagnosis Date  . Hypertension 2010  . GERD (gastroesophageal reflux disease)   . Hyperlipidemia   . Coronary artery disease      Home Meds: Outpatient Prescriptions Prior to Visit  Medication Sig Dispense Refill  . aspirin 81 MG tablet Take 81 mg by mouth daily.      . metoprolol (LOPRESSOR) 100 MG tablet TAKE ONE AND ONE-HALF TABLETS BY MOUTH TWICE DAILY. NEEDS APPOINTMENT FOR FUTURE REFILLS. 90 tablet 6  . omeprazole (PRILOSEC) 20  MG capsule TAKE ONE CAPSULE BY MOUTH TWICE DAILY 60 capsule 11  . ramipril (ALTACE) 10 MG capsule Take 1 capsule (10 mg total) by mouth daily. 90 capsule 0  . rosuvastatin (CRESTOR) 10 MG tablet TAKE ONE TABLET BY MOUTH DAILY. 90 tablet 3   No facility-administered medications prior to visit.    Allergies:  Allergies  Allergen Reactions  . Niaspan [Niacin Er] Itching    Lower legs    Social History   Social History  . Marital Status: Married    Spouse Name: N/A  . Number of Children: N/A  . Years of Education: N/A   Occupational History  . Not on file.   Social History Main Topics  . Smoking status: Former Smoker -- 2.00 packs/day for 13 years    Types: Cigarettes    Quit date: 03/15/1976  . Smokeless tobacco: Never Used  . Alcohol Use: No  . Drug Use: No  . Sexual Activity: Not on file   Other Topics Concern  . Not on file   Social History Narrative    Family History  Problem Relation Age of Onset  . Heart failure Mother   . Heart failure Father   . Cancer Sister      Review of Systems:  See HPI for pertinent ROS. All other ROS negative.  Physical Exam: Blood pressure 114/62, pulse 84, temperature 98.6 F (37 C), temperature source Oral, resp. rate 16, height 5\' 9"  (1.753 m), weight 229 lb (103.874 kg)., Body mass index is 33.8 kg/(m^2). General: WNWD Male. Appears in no acute distress. Neck: Supple. No thyromegaly. No lymphadenopathy. Lungs: Clear bilaterally to auscultation without wheezes, rales, or rhonchi. Breathing is unlabored. Heart: RRR with S1 S2. No murmurs, rubs, or gallops. Abdomen: Soft, non-tender, non-distended with normoactive bowel sounds. No hepatomegaly. No rebound/guarding. No obvious abdominal masses. Musculoskeletal:  Strength and tone normal for age. No costophrenic angle tenderness with percussion bilaterally.  Extremities/Skin: Warm and dry.  Neuro: Alert and oriented X 3. Moves all extremities spontaneously. Gait is normal.  CNII-XII grossly in tact. Psych:  Responds to questions appropriately with a normal affect.     ASSESSMENT AND PLAN:  65 y.o. year old male with  1. Hematuria Reassured pt that UA today shows no blood, no sign of infection.  Blood could have been secondary to a skin lesion/abrasion.  For further reassurance, told him we can check another UA in 1 -2 weeks to make sure still no sign of blood in urine at that time.  As well, F/U if any re-occurrence./ Any sign of blood.  - Urinal[facility], [facility] - [facility]inal[facility], [facility] w reflex [facility]); Future   Signed, Shon HaleMary Beth AnsoniaDixon, [place], [facility] 02/20/2016 3:57 PM

## 2016-03-05 ENCOUNTER — Other Ambulatory Visit: Payer: BC Managed Care – PPO

## 2016-03-05 DIAGNOSIS — R319 Hematuria, unspecified: Secondary | ICD-10-CM

## 2016-03-05 LAB — URINALYSIS, ROUTINE W REFLEX MICROSCOPIC
Bilirubin Urine: NEGATIVE
Glucose, UA: NEGATIVE
Hgb urine dipstick: NEGATIVE
Ketones, ur: NEGATIVE
Leukocytes, UA: NEGATIVE
Nitrite: NEGATIVE
Protein, ur: NEGATIVE
Specific Gravity, Urine: 1.025 (ref 1.001–1.035)
pH: 5.5 (ref 5.0–8.0)

## 2016-04-05 ENCOUNTER — Encounter: Payer: Self-pay | Admitting: Family Medicine

## 2016-04-05 ENCOUNTER — Ambulatory Visit (INDEPENDENT_AMBULATORY_CARE_PROVIDER_SITE_OTHER): Payer: BC Managed Care – PPO | Admitting: Family Medicine

## 2016-04-05 VITALS — BP 122/70 | HR 82 | Temp 98.0°F | Resp 14 | Wt 226.0 lb

## 2016-04-05 DIAGNOSIS — R319 Hematuria, unspecified: Secondary | ICD-10-CM | POA: Diagnosis not present

## 2016-04-05 DIAGNOSIS — Z125 Encounter for screening for malignant neoplasm of prostate: Secondary | ICD-10-CM | POA: Diagnosis not present

## 2016-04-05 NOTE — Progress Notes (Signed)
Subjective:    Patient ID: Don Aguilar, male    DOB: [birth year], 65 y.o.   MRN: [id number]  HPI  Over the last week or so the patient has had 4 episodes of gross hematuria. Frequently he will wake up and find at teaspoon of blood in his underwear and then filled the urgent need to urinate. There will be a twinge of pain at the tip of his penis and then the pain will stop. This is happened on 2 separate occasions. On 2 other occasions, he is seen blood in the toilet when he is urinating. He denies any fevers or chills. Otherwise he denies any dysuria urgency frequency or low back pain. Past Medical History:  Diagnosis Date  . Coronary artery disease   . GERD (gastroesophageal reflux disease)   . Hyperlipidemia   . Hypertension 2010   Past Surgical History:  Procedure Laterality Date  . CARDIAC CATHETERIZATION  06/08/2009   No intervention - recommend CABG  . CARDIOVASCULAR STRESS TEST  05/30/2012   Small area of possible ischemia in basal septal wall. ECG negative for ischemia, but frequent PVCs continued from mid to peak exercise with  Bigeminy pattern.  . CORONARY ARTERY BYPASS GRAFT  06/10/2009   x4. LIMA to LAD, SVG to first diag, SVG to circumflex, SVG to RCA. Endoscopic vein harvest from right thigh and right lower leg.  Marland Kitchen HAND TENDON SURGERY  left hand, 1972  . TEE WITHOUT CARDIOVERSION  06/10/2009   EF 60-65%, Normal-trace findings  . VASCULAR EVALUATION  06/09/2009   No significant extracranial carotid artery stenosis demonstrated. Vertebrals are patent with antegrade flow.   Current Outpatient Prescriptions on File Prior to Visit  Medication Sig Dispense Refill  . aspirin 81 MG tablet Take 81 mg by mouth daily.      . metoprolol (LOPRESSOR) 100 MG tablet TAKE ONE AND ONE-HALF TABLETS BY MOUTH TWICE DAILY. NEEDS APPOINTMENT FOR FUTURE REFILLS. 90 tablet 6  . omeprazole (PRILOSEC) 20 MG capsule TAKE ONE CAPSULE BY MOUTH TWICE DAILY 60 capsule 11  . ramipril (ALTACE) 10 MG  capsule Take 1 capsule (10 mg total) by mouth daily. 90 capsule 0  . rosuvastatin (CRESTOR) 10 MG tablet TAKE ONE TABLET BY MOUTH DAILY. 90 tablet 3   No current facility-administered medications on file prior to visit.    Allergies  Allergen Reactions  . Niaspan [Niacin Er] Itching    Lower legs   Social History   Social History  . Marital status: Married    Spouse name: N/A  . Number of children: N/A  . Years of education: N/A   Occupational History  . Not on file.   Social History Main Topics  . Smoking status: Former Smoker    Packs/day: 2.00    Years: 13.00    Types: Cigarettes    Quit date: 03/15/1976  . Smokeless tobacco: Never Used  . Alcohol use No  . Drug use: No  . Sexual activity: Not on file   Other Topics Concern  . Not on file   Social History Narrative  . No narrative on file     Review of Systems  All other systems reviewed and are negative.      Objective:   Physical Exam  Cardiovascular: Normal rate, regular rhythm and normal heart sounds.   Pulmonary/Chest: Effort normal and breath sounds normal. No respiratory distress. He has no wheezes. He has no rales.  Abdominal: Soft. Bowel sounds are normal. He exhibits no distension.  There is no tenderness. There is no rebound. Hernia confirmed negative in the right inguinal area and confirmed negative in the left inguinal area.  Genitourinary: Testes normal and penis normal. Right testis shows no tenderness. Left testis shows no mass and no tenderness. No phimosis, paraphimosis, hypospadias, penile erythema or penile tenderness. No discharge found.  Lymphadenopathy:       Right: No inguinal adenopathy present.       Left: No inguinal adenopathy present.  Vitals reviewed.         Assessment & Plan:  Hematuria - Plan: CBC with Differential/Platelet, COMPLETE METABOLIC PANEL WITH GFR, PSA, CT RENAL STONE STUDY, Ambulatory referral to Urology  Prostate cancer screening - Plan: PSA  I suspect  the patient has passed several small kidney stones. I will order a CT scan of the abdomen and pelvis to evaluate for kidney stones as well as pathologic lesions on the kidney. I will also consult urology for cystoscopy. While checking lab work, I will also screen for prostate cancer

## 2016-04-06 LAB — COMPLETE METABOLIC PANEL WITH GFR
ALT: 21 U/L (ref 9–46)
AST: 20 U/L (ref 10–35)
Albumin: 4 g/dL (ref 3.6–5.1)
Alkaline Phosphatase: 90 U/L (ref 40–115)
BUN: 18 mg/dL (ref 7–25)
CO2: 22 mmol/L (ref 20–31)
Calcium: 9.1 mg/dL (ref 8.6–10.3)
Chloride: 108 mmol/L (ref 98–110)
Creat: 1.13 mg/dL (ref 0.70–1.25)
GFR, Est African American: 79 mL/min (ref >=60)
GFR, Est Non African American: 68 mL/min (ref >=60)
Glucose, Bld: 101 mg/dL — ABNORMAL HIGH (ref 70–99)
Potassium: 4 mmol/L (ref 3.5–5.3)
Sodium: 141 mmol/L (ref 135–146)
Total Bilirubin: 0.3 mg/dL (ref 0.2–1.2)
Total Protein: 6.3 g/dL (ref 6.1–8.1)

## 2016-04-06 LAB — CBC WITH DIFFERENTIAL/PLATELET
Basophils Absolute: 0 cells/uL (ref 0–200)
Basophils Relative: 0 %
Eosinophils Absolute: 154 cells/uL (ref 15–500)
Eosinophils Relative: 2 %
HCT: 41.6 % (ref 38.5–50.0)
Hemoglobin: 13.9 g/dL (ref 13.0–17.0)
Lymphocytes Relative: 33 %
Lymphs Abs: 2541 cells/uL (ref 850–3900)
MCH: 29.9 pg (ref 27.0–33.0)
MCHC: 33.4 g/dL (ref 32.0–36.0)
MCV: 89.5 fL (ref 80.0–100.0)
MPV: 12 fL (ref 7.5–12.5)
Monocytes Absolute: 693 cells/uL (ref 200–950)
Monocytes Relative: 9 %
Neutro Abs: 4312 cells/uL (ref 1500–7800)
Neutrophils Relative %: 56 %
Platelets: 159 10*3/uL (ref 140–400)
RBC: 4.65 MIL/uL (ref 4.20–5.80)
RDW: 13.7 % (ref 11.0–15.0)
WBC: 7.7 10*3/uL (ref 3.8–10.8)

## 2016-04-06 LAB — PSA: PSA: 0.4 ng/mL (ref <=4.0)

## 2016-04-11 ENCOUNTER — Encounter: Payer: Self-pay | Admitting: Family Medicine

## 2016-04-17 ENCOUNTER — Other Ambulatory Visit: Payer: Self-pay | Admitting: Family Medicine

## 2016-04-18 NOTE — Telephone Encounter (Signed)
Refill appropriate and filled per protocol. 

## 2016-05-09 ENCOUNTER — Ambulatory Visit
Admission: RE | Admit: 2016-05-09 | Discharge: 2016-05-09 | Disposition: A | Discharge status: Home / Self Care | Payer: BC Managed Care – PPO | Source: Ambulatory Visit | Attending: Family Medicine | Admitting: Family Medicine

## 2016-05-09 DIAGNOSIS — R319 Hematuria, unspecified: Secondary | ICD-10-CM

## 2016-05-14 ENCOUNTER — Encounter: Payer: Self-pay | Admitting: Family Medicine

## 2016-05-24 ENCOUNTER — Telehealth: Payer: Self-pay | Admitting: Family Medicine

## 2016-05-24 NOTE — Telephone Encounter (Signed)
Patient's wife left vm this morning stating patient has had imaging done and it didn't show anything wrong. She wants to know if patient still needs to go to Urologist. She states that if insurance isn't going to pay and there is nothing wrong she is going to cancel appt.  CB#[phone]

## 2016-05-29 NOTE — Telephone Encounter (Signed)
Patient aware of providers recommendations via vm 

## 2016-05-29 NOTE — Telephone Encounter (Signed)
I would still see urologist given blood in urine for cystoscopy.  Inside of bladder cannot be evaluated on ct scan.

## 2016-07-12 ENCOUNTER — Other Ambulatory Visit: Payer: Self-pay | Admitting: Cardiology

## 2016-07-12 ENCOUNTER — Other Ambulatory Visit: Payer: Self-pay | Admitting: Family Medicine

## 2016-07-12 NOTE — Telephone Encounter (Signed)
Rx(s) sent to pharmacy electronically.  

## 2016-07-13 ENCOUNTER — Other Ambulatory Visit: Payer: Self-pay | Admitting: Cardiology

## 2016-07-13 NOTE — Telephone Encounter (Signed)
Rx request sent to pharmacy.  

## 2016-08-15 ENCOUNTER — Other Ambulatory Visit: Payer: Self-pay | Admitting: Cardiovascular Disease

## 2016-08-23 ENCOUNTER — Other Ambulatory Visit: Payer: Self-pay | Admitting: Cardiology

## 2016-09-26 ENCOUNTER — Other Ambulatory Visit: Payer: Self-pay | Admitting: Cardiovascular Disease

## 2016-09-26 NOTE — Telephone Encounter (Signed)
Rx request sent to pharmacy.  

## 2016-10-25 ENCOUNTER — Other Ambulatory Visit: Payer: Self-pay | Admitting: Cardiovascular Disease

## 2016-10-25 NOTE — Telephone Encounter (Signed)
REFILL 

## 2016-11-10 ENCOUNTER — Other Ambulatory Visit: Payer: Self-pay | Admitting: Cardiovascular Disease

## 2016-11-13 ENCOUNTER — Other Ambulatory Visit: Payer: Self-pay | Admitting: Cardiovascular Disease

## 2016-11-16 ENCOUNTER — Other Ambulatory Visit: Payer: Self-pay | Admitting: Cardiovascular Disease

## 2016-11-19 ENCOUNTER — Other Ambulatory Visit: Payer: Self-pay | Admitting: *Deleted

## 2016-11-19 MED ORDER — METOPROLOL TARTRATE 100 MG PO TABS: ORAL_TABLET | ORAL | 0 refills | Status: DC

## 2016-11-26 ENCOUNTER — Other Ambulatory Visit: Payer: Self-pay | Admitting: Cardiovascular Disease

## 2016-11-26 NOTE — Telephone Encounter (Signed)
Rx request sent to pharmacy.  

## 2016-11-27 ENCOUNTER — Ambulatory Visit: Payer: BC Managed Care – PPO | Admitting: Cardiovascular Disease

## 2016-11-29 ENCOUNTER — Encounter: Payer: Self-pay | Admitting: Family Medicine

## 2016-12-05 NOTE — Telephone Encounter (Signed)
New message     *STAT* If patient is at the pharmacy, call can be transferred to refill team.   1. Which medications need to be refilled? (please list name of each medication and dose if known) metoprolol 100 mg  2. Which pharmacy/location (including street and city if local pharmacy) is medication to be sent to? [facility] [place]  3. Do they need a 30 day or 90 day supply? 30 day

## 2016-12-06 ENCOUNTER — Telehealth: Payer: Self-pay | Admitting: Cardiovascular Disease

## 2016-12-06 ENCOUNTER — Other Ambulatory Visit: Payer: Self-pay | Admitting: Cardiovascular Disease

## 2016-12-06 MED ORDER — METOPROLOL TARTRATE 25 MG PO TABS: 25.0000 mg | ORAL_TABLET | Freq: Two times a day (BID) | ORAL | 0 refills | Status: DC

## 2016-12-06 NOTE — Telephone Encounter (Signed)
rx sent to pharmacy

## 2016-12-06 NOTE — Telephone Encounter (Signed)
Rx refilled (see other encounter).  Phone rings w/o answer/VM pickup -- Unable to reach caller to inform that Rx was sent.

## 2016-12-06 NOTE — Telephone Encounter (Signed)
New message       *STAT* If patient is at the pharmacy, call can be transferred to refill team.   1. Which medications need to be refilled? (please list name of each medication and dose if known) metoprolol  2. Which pharmacy/location (including street and city if local pharmacy) is [facility] to be [facility] to? [email or web address] 3. Do they need a 30 day or 90 day supply? 30 day

## 2016-12-20 ENCOUNTER — Other Ambulatory Visit: Payer: Self-pay | Admitting: Cardiovascular Disease

## 2016-12-26 ENCOUNTER — Encounter: Payer: Self-pay | Admitting: Family Medicine

## 2016-12-28 ENCOUNTER — Ambulatory Visit: Payer: Medicare Other | Admitting: Cardiovascular Disease

## 2017-01-02 ENCOUNTER — Encounter: Payer: Self-pay | Admitting: Cardiovascular Disease

## 2017-01-02 ENCOUNTER — Ambulatory Visit (INDEPENDENT_AMBULATORY_CARE_PROVIDER_SITE_OTHER): Payer: Medicare Other | Admitting: Cardiovascular Disease

## 2017-01-02 VITALS — BP 121/82 | HR 77 | Ht 69.0 in | Wt 213.0 lb

## 2017-01-02 DIAGNOSIS — R002 Palpitations: Secondary | ICD-10-CM | POA: Diagnosis not present

## 2017-01-02 DIAGNOSIS — Z79899 Other long term (current) drug therapy: Secondary | ICD-10-CM

## 2017-01-02 DIAGNOSIS — E785 Hyperlipidemia, unspecified: Secondary | ICD-10-CM

## 2017-01-02 DIAGNOSIS — I2510 Atherosclerotic heart disease of native coronary artery without angina pectoris: Secondary | ICD-10-CM | POA: Diagnosis not present

## 2017-01-02 DIAGNOSIS — I10 Essential (primary) hypertension: Secondary | ICD-10-CM | POA: Diagnosis not present

## 2017-01-02 DIAGNOSIS — Z951 Presence of aortocoronary bypass graft: Secondary | ICD-10-CM | POA: Diagnosis not present

## 2017-01-02 DIAGNOSIS — R0600 Dyspnea, unspecified: Secondary | ICD-10-CM

## 2017-01-02 DIAGNOSIS — R0609 Other forms of dyspnea: Secondary | ICD-10-CM | POA: Diagnosis not present

## 2017-01-02 MED ORDER — METOPROLOL TARTRATE 50 MG PO TABS: ORAL_TABLET | ORAL | 6 refills | Status: DC

## 2017-01-02 NOTE — Progress Notes (Signed)
Patient ID: Don Aguilar, male   DOB: 29-Dec-1950, 66 y.o.   MRN: [id number]     HPI: Don Aguilar is a 66 y.o. male who presents to the office for an 21 month followup cardiology evaluation.  [name] was found to have severe multivessel CAD in October 2010.  He underwent CABG revascularization surgery by Dr. Nils Pyle with the LIMA to the LAD, a vein to the first diagonal, vein to the circumflex marginal, and vein to the distal RCA.  Additional problems include obesity, hypertension, as well as mixed hyperlipidemia.  He also has a history of mild obstructive sleep apnea which was diagnosed 2 years ago. He did undergo a CPAP titration trial at 11 cm water pressure was recommended but he never followed up and had CPAP initiation. His AHI was 8.5 per hour and his RDI was 25.4 per hour. During REM sleep AHI was 10.8 and RDI 23.1.  When saw him in February 2060. He was working as a Air traffic controller at [employer].  Since I last saw him, he retired 2 months ago.  He denies any episodes of chest pain.  However, he notes shortness of breath particularly with walking up steps.  When I last saw him, he had been on high-dose beta blocker therapy with metoprolol  150 mg bid which had controlled his palpitations.  Apparently, the last time he filled this prescription it appears that inadvertently he was given a prescription for only metoprolol 25 mg twice a day.  On his reduced dose, he has noticed more palpitations particularly at night.  He denies any presyncope.   He denies significant hypersomnolence.  He typically goes to bed at 10 PM and wakes up at 4:30 AM.  He does snore.  He presents for evaluation.   Past Medical History:  Diagnosis Date  . Coronary artery disease   . GERD (gastroesophageal reflux disease)   . Hyperlipidemia   . Hypertension 2010    Past Surgical History:  Procedure Laterality Date  . CARDIAC CATHETERIZATION  06/08/2009   No intervention - recommend CABG  .  CARDIOVASCULAR STRESS TEST  05/30/2012   Small area of possible ischemia in basal septal wall. ECG negative for ischemia, but frequent PVCs continued from mid to peak exercise with  Bigeminy pattern.  . CORONARY ARTERY BYPASS GRAFT  06/10/2009   x4. LIMA to LAD, SVG to first diag, SVG to circumflex, SVG to RCA. Endoscopic vein harvest from right thigh and right lower leg.  Marland Kitchen HAND TENDON SURGERY  left hand, 1972  . TEE WITHOUT CARDIOVERSION  06/10/2009   EF 60-65%, Normal-trace findings  . VASCULAR EVALUATION  06/09/2009   No significant extracranial carotid artery stenosis demonstrated. Vertebrals are patent with antegrade flow.    Allergies  Allergen Reactions  . Niaspan [Niacin Er] Itching    Lower legs    Current Outpatient Prescriptions  Medication Sig Dispense Refill  . aspirin 81 MG tablet Take 81 mg by mouth daily.      Marland Kitchen omeprazole (PRILOSEC) 20 MG capsule TAKE ONE CAPSULE BY MOUTH TWICE DAILY 60 capsule 11  . ramipril (ALTACE) 10 MG capsule TAKE ONE CAPSULE BY MOUTH ONCE DAILY 30 capsule 11  . rosuvastatin (CRESTOR) 10 MG tablet Take 1 tablet (10 mg total) by mouth daily. 30 tablet 2  . metoprolol (LOPRESSOR) 50 MG tablet Take 75-100 twice a day as directed by Dr Claiborne Billings 60 tablet 6   No current facility-administered medications for this visit.  Social History   Social History  . Marital status: Married    Spouse name: Don Aguilar  . Number of children: Don Aguilar  . Years of education: Don Aguilar   Occupational History  . Not on file.   Social History Main Topics  . Smoking status: Former Smoker    Packs/day: 2.00    Years: 13.00    Types: Cigarettes    Quit date: 03/15/1976  . Smokeless tobacco: Never Used  . Alcohol use No  . Drug use: No  . Sexual activity: Not on file   Other Topics Concern  . Not on file   Social History Narrative  . 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  Family History  Problem Relation Age of Onset  . Heart failure Mother   . Heart failure Father   . Cancer Sister    ROS General: Negative; No fevers, chills, or night sweats;  HEENT: Negative; No changes in vision or hearing, sinus congestion, difficulty swallowing Pulmonary: Negative; No cough, wheezing, shortness of breath, hemoptysis Cardiovascular: see HPI GI: Negative; No nausea, vomiting, diarrhea, or abdominal pain GU: Negative; No dysuria, hematuria, or difficulty voiding Musculoskeletal: Negative; no myalgias, joint pain, or weakness Hematologic/Oncology: Negative; no easy bruising, bleeding Endocrine: Negative; no heat/cold intolerance; no diabetes Neuro: Negative; no changes in balance, headaches Skin: Negative; No rashes or skin lesions Psychiatric: Negative; No behavioral problems, depression Sleep: Positive for mild sleep apnea; No snoring, daytime sleepiness, hypersomnolence, bruxism, restless legs, hypnogognic hallucinations, no cataplexy Other comprehensive 14 point system review is negative.  PE BP 121/82   Pulse 77   Ht _0  (1.753 m)   Wt 213 lb (96.6 kg)   BMI 31.45 kg/m    Repeat blood pressure 128/82  Wt Readings from Last 3 Encounters:  01/02/17 213 lb (96.6 kg)  04/05/16 226 lb (102.5 kg)  02/20/16 229 lb (103.9 kg)   General: Alert, oriented, no distress moderate obesity Skin: normal turgor, no rashes HEENT: Normocephalic, atraumatic. Pupils round and reactive; sclera anicteric;no lid lag. Extraocular muscles intact;; no xanthelasmas. Nose without nasal septal hypertrophy Mouth/Parynx benign; Mallinpatti scale 3 Neck: No JVD, no carotid bruits; normal carotid upstroke Lungs: clear to ausculatation and percussion; no wheezing or rales Chest wall: no tenderness to palpitation Heart: RRR, s1 s2 normal;  1/6 systolic murmur,no diastolic murmur, rub thrills or heaves Abdomen: soft, nontender; no  hepatosplenomehaly, BS+; abdominal aorta nontender and not dilated by palpation. Back: no CVA tenderness Pulses 2+ Extremities: no clubbing cyanosis or edema, Homan's sign negative  Neurologic: grossly nonfocal; cranial nerves grossly normal. Psychologic: normal affect and mood.   ECG (independently read by me): Normal sinus rhythm at 77 bpm.  Nonspecific T changes precordial.  Normal intervals.  November 2016 ECG (independently read by me): Sinus bradycardia 52 bpm.  Previously noted T-wave abnormality inferolaterally  February 2015 ECG (independently read by me): Normal sinus rhythm at 66 beats per minute. Nonspecific T. wave abnormality.  LABS:  BMP Latest Ref Rng & Units 04/05/2016 06/02/2015 10/26/2013  Glucose 70 - 99 mg/dL 101(H) 74 91  BUN 7 - 25 mg/dL _1 Creatinine 0.70 - 1.25 mg/dL 1.13 1.06 1.15  Sodium 135 - 146 mmol/L 141 137 142  Potassium 3.5 - 5.3 mmol/L 4.0 4.3 4.0  Chloride 98 - 110 mmol/L 108 102 106  CO2 20 - 31 mmol/L  _0 Calcium 8.6 - 10.3 mg/dL 9.1 8.8 9.3   Hepatic Function Latest Ref Rng & Units 04/05/2016 06/02/2015 10/26/2013  Total Protein 6.1 - 8.1 g/dL 6.3 6.4 7.0  Albumin 3.6 - 5.1 g/dL 4.0 3.9 4.1  AST 10 - 35 U/L _1 ALT 9 - 46 U/L _2 Alk Phosphatase 40 - 115 U/L 90 84 83  Total Bilirubin 0.2 - 1.2 mg/dL 0.3 0.3 0.5   CBC Latest Ref Rng & Units 04/05/2016 06/02/2015 10/26/2013  WBC 3.8 - 10.8 K/uL 7.7 8.3 7.3  Hemoglobin 13.0 - 17.0 g/dL 13.9 13.9 14.6  Hematocrit 38.5 - 50.0 % 41.6 41.8 42.9  Platelets 140 - 400 K/uL 159 182 160   Lab Results  Component Value Date   MCV 89.5 04/05/2016   MCV 91.1 06/02/2015   MCV 87.6 10/26/2013   Lab Results  Component Value Date   TSH 2.087 10/26/2013   Lab Results  Component Value Date   HGBA1C  06/10/2009    5.9 (NOTE) The ADA recommends the following therapeutic goal for glycemic control related to Hgb A1c measurement: Goal of therapy: <6.5 Hgb A1c  Reference: American Diabetes  Association: Clinical Practice Recommendations 2010, Diabetes Care, 2010, 33: (Suppl  1).   Lipid Panel     Component Value Date/Time   CHOL 112 (L) 06/02/2015 0941   TRIG 135 06/02/2015 0941   HDL 23 (L) 06/02/2015 0941   CHOLHDL 4.9 06/02/2015 0941   VLDL 27 06/02/2015 0941   LDLCALC 62 06/02/2015 0941   RADIOLOGY: No results found.  IMPRESSION:  1. Coronary artery disease involving native coronary artery of native heart without angina pectoris   2. Essential hypertension   3. Hyperlipidemia with target LDL less than 70   4. Exertional dyspnea   5. Medication management   6. Palpitations     ASSESSMENT AND PLAN: Mr. Angelos Wasco is a 66 year old African American male who has a history of moderate obesity and in October 2010 was found to have severe multivessel CAD after he had developed exertional chest pain symptomatology suggestive of ischemia. He underwent CABG surgery due to severe multivessel CAD.  A nuclear perfusion study in October 2013 which revealed mild attenuation artifact. He did develop ectopy but his beta blocker was held for this study.  When I last saw him, sensitive been 6 years since his last nuclear study.  I had recommended a follow-up evaluation.  He never had this done.  His resting pulse today is increased compared to previously, but he now is on a markedly reduced metoprolol dose at just 25 mg twice a day whereas previously he was on 150 mg twice a day.  I will now titrate metoprolol to 50 mg twice a day and if he still experiencing palpitations he will increase this to 75 mg twice a day and possibly 100 mg twice a day.  I am scheduling him for follow-up echo Doppler study for reevaluation.  He is not having any chest pain.  Laboratory will be obtained.  I will see him in 3 months for reevaluation. Time spent: 25 minutes Troy Sine, MD, [facility]  01/04/2017 1:58 PM

## 2017-01-02 NOTE — Patient Instructions (Addendum)
Your physician has requested that you have an echocardiogram. Echocardiography is a painless test that uses sound waves to create images of your heart. It provides your doctor with information about the size and shape of your heart and how well your heart's chambers and valves are working. This procedure takes approximately one hour. There are no restrictions for this procedure.  Your physician has recommended you make the following change in your medication:   1.) the metoprolol has been changed. Take as directed below:  - take 50 mg twice a day for 1 week.  ( 1 tablet) Then increase to 75 mg twice a day. ( 1 & 1/2 tablets).  - if palpitations persist increase to 100 mg twice a day. ( 2 tablets)  Your physician recommends that you schedule a follow-up appointment in: 3 months.

## 2017-01-16 ENCOUNTER — Other Ambulatory Visit (HOSPITAL_COMMUNITY): Payer: Medicare Other

## 2017-01-30 DIAGNOSIS — R0609 Other forms of dyspnea: Secondary | ICD-10-CM | POA: Diagnosis not present

## 2017-01-30 DIAGNOSIS — I10 Essential (primary) hypertension: Secondary | ICD-10-CM | POA: Diagnosis not present

## 2017-01-30 DIAGNOSIS — R002 Palpitations: Secondary | ICD-10-CM | POA: Diagnosis not present

## 2017-01-30 DIAGNOSIS — I2510 Atherosclerotic heart disease of native coronary artery without angina pectoris: Secondary | ICD-10-CM | POA: Diagnosis not present

## 2017-01-30 DIAGNOSIS — E785 Hyperlipidemia, unspecified: Secondary | ICD-10-CM | POA: Diagnosis not present

## 2017-01-30 LAB — COMPREHENSIVE METABOLIC PANEL
ALT: 17 U/L (ref 9–46)
AST: 18 U/L (ref 10–35)
Albumin: 3.9 g/dL (ref 3.6–5.1)
Alkaline Phosphatase: 95 U/L (ref 40–115)
BUN: 17 mg/dL (ref 7–25)
CO2: 25 mmol/L (ref 20–31)
Calcium: 8.8 mg/dL (ref 8.6–10.3)
Chloride: 105 mmol/L (ref 98–110)
Creat: 1.08 mg/dL (ref 0.70–1.25)
Glucose, Bld: 85 mg/dL (ref 65–99)
Potassium: 4.1 mmol/L (ref 3.5–5.3)
Sodium: 140 mmol/L (ref 135–146)
Total Bilirubin: 0.5 mg/dL (ref 0.2–1.2)
Total Protein: 6.6 g/dL (ref 6.1–8.1)

## 2017-01-30 LAB — LIPID PANEL
Cholesterol: 164 mg/dL (ref <200)
HDL: 26 mg/dL — ABNORMAL LOW (ref >40)
LDL Cholesterol: 91 mg/dL (ref <100)
Total CHOL/HDL Ratio: 6.3 Ratio — ABNORMAL HIGH (ref <5.0)
Triglycerides: 235 mg/dL — ABNORMAL HIGH (ref <150)
VLDL: 47 mg/dL — ABNORMAL HIGH (ref <30)

## 2017-01-30 LAB — CBC
HCT: 42 % (ref 38.5–50.0)
Hemoglobin: 13.9 g/dL (ref 13.2–17.1)
MCH: 30.1 pg (ref 27.0–33.0)
MCHC: 33.1 g/dL (ref 32.0–36.0)
MCV: 90.9 fL (ref 80.0–100.0)
MPV: 11.9 fL (ref 7.5–12.5)
Platelets: 151 10*3/uL (ref 140–400)
RBC: 4.62 MIL/uL (ref 4.20–5.80)
RDW: 13.6 % (ref 11.0–15.0)
WBC: 7.6 10*3/uL (ref 3.8–10.8)

## 2017-01-31 ENCOUNTER — Ambulatory Visit (HOSPITAL_COMMUNITY): Payer: Medicare Other | Attending: Cardiology

## 2017-01-31 ENCOUNTER — Other Ambulatory Visit: Payer: Self-pay

## 2017-01-31 DIAGNOSIS — I517 Cardiomegaly: Secondary | ICD-10-CM | POA: Insufficient documentation

## 2017-01-31 DIAGNOSIS — R002 Palpitations: Secondary | ICD-10-CM | POA: Insufficient documentation

## 2017-01-31 LAB — TSH: TSH: 2.76 mIU/L (ref 0.40–4.50)

## 2017-01-31 LAB — MAGNESIUM: Magnesium: 1.6 mg/dL (ref 1.5–2.5)

## 2017-02-25 ENCOUNTER — Telehealth: Payer: Self-pay | Admitting: *Deleted

## 2017-02-25 ENCOUNTER — Encounter: Payer: Self-pay | Admitting: *Deleted

## 2017-02-25 ENCOUNTER — Other Ambulatory Visit: Payer: Self-pay | Admitting: *Deleted

## 2017-02-25 DIAGNOSIS — E785 Hyperlipidemia, unspecified: Secondary | ICD-10-CM

## 2017-02-25 DIAGNOSIS — Z79899 Other long term (current) drug therapy: Secondary | ICD-10-CM

## 2017-02-25 MED ORDER — ROSUVASTATIN CALCIUM 40 MG PO TABS: 40.0000 mg | ORAL_TABLET | Freq: Every day | ORAL | 3 refills | Status: DC

## 2017-02-25 NOTE — Telephone Encounter (Signed)
Patient notified of lab results and recommendations. He voiced verbal understanding of instructions.

## 2017-02-25 NOTE — Telephone Encounter (Signed)
-----   Message from Lennette Biharihomas A Kelly, MD sent at 02/19/2017  9:18 AM EDT ----- Labs stable with the exception of lipids; would increase Crestor to 40 mg with target LDL less than 70.  Reduce carbohydrates, sweets and sugars.  Add omega 3 free fatty acids 2 capsules daily

## 2017-02-26 ENCOUNTER — Telehealth: Payer: Self-pay | Admitting: Cardiovascular Disease

## 2017-02-26 NOTE — Telephone Encounter (Signed)
New message   Pt calling for results from his ECHO

## 2017-02-26 NOTE — Telephone Encounter (Signed)
Patient aware of results and verbalized understanding.

## 2017-03-05 ENCOUNTER — Encounter (HOSPITAL_COMMUNITY): Payer: Self-pay | Admitting: Emergency Medicine

## 2017-03-05 ENCOUNTER — Ambulatory Visit (INDEPENDENT_AMBULATORY_CARE_PROVIDER_SITE_OTHER): Payer: Medicare Other

## 2017-03-05 ENCOUNTER — Ambulatory Visit (HOSPITAL_COMMUNITY)
Admission: EM | Admit: 2017-03-05 | Discharge: 2017-03-05 | Disposition: A | Discharge status: Home / Self Care | Payer: Medicare Other | Attending: Family Medicine | Admitting: Family Medicine

## 2017-03-05 DIAGNOSIS — T148XXA Other injury of unspecified body region, initial encounter: Secondary | ICD-10-CM | POA: Diagnosis not present

## 2017-03-05 DIAGNOSIS — S67192A Crushing injury of right middle finger, initial encounter: Secondary | ICD-10-CM

## 2017-03-05 DIAGNOSIS — S6710XA Crushing injury of unspecified finger(s), initial encounter: Secondary | ICD-10-CM

## 2017-03-05 DIAGNOSIS — Z23 Encounter for immunization: Secondary | ICD-10-CM

## 2017-03-05 DIAGNOSIS — S61212A Laceration without foreign body of right middle finger without damage to nail, initial encounter: Secondary | ICD-10-CM | POA: Diagnosis not present

## 2017-03-05 MED ORDER — TETANUS-DIPHTH-ACELL PERTUSSIS 5-2.5-18.5 LF-MCG/0.5 IM SUSP
INTRAMUSCULAR | Status: AC
Start: 2017-03-05 — End: 2017-03-05
  Filled 2017-03-05: qty 0.5

## 2017-03-05 MED ORDER — TETANUS-DIPHTH-ACELL PERTUSSIS 5-2.5-18.5 LF-MCG/0.5 IM SUSP
0.5000 mL | Freq: Once | INTRAMUSCULAR | Status: AC
  Administered 2017-03-05: 0.5 mL via INTRAMUSCULAR

## 2017-03-05 NOTE — Discharge Instructions (Signed)
Return if any problems.

## 2017-03-05 NOTE — ED Triage Notes (Signed)
[facility] a [facility] of a pinch injury to the 3rd finger of his right hand. The patient stated that it got pinched between 2 panels of a garage door as it was closing.

## 2017-03-05 NOTE — ED Provider Notes (Signed)
CSN: [id number]     Arrival date & time 03/05/17  1231 History   None    Chief Complaint  Patient presents with  . Finger Injury   (Consider location/radiation/quality/duration/timing/severity/associated sxs/prior Treatment) The history is provided by the patient. No language interpreter was used.  Hand Pain  This is a new problem. The current episode started 1 to 2 hours ago. The problem occurs constantly. The problem has been gradually worsening. Nothing aggravates the symptoms. Nothing relieves the symptoms.  Pt reports he crushed finger in between the panels of his garage door.  Pt complain of swelling and a cut on both sides of his finger  Past Medical History:  Diagnosis Date  . Coronary artery disease   . GERD (gastroesophageal reflux disease)   . Hyperlipidemia   . Hypertension 2010   Past Surgical History:  Procedure Laterality Date  . CARDIAC CATHETERIZATION  06/08/2009   No intervention - recommend CABG  . CARDIOVASCULAR STRESS TEST  05/30/2012   Small area of possible ischemia in basal septal wall. ECG negative for ischemia, but frequent PVCs continued from mid to peak exercise with  Bigeminy pattern.  . CORONARY ARTERY BYPASS GRAFT  06/10/2009   x4. LIMA to LAD, SVG to first diag, SVG to circumflex, SVG to RCA. Endoscopic vein harvest from right thigh and right lower leg.  Marland Kitchen HAND TENDON SURGERY  left hand, 1972  . TEE WITHOUT CARDIOVERSION  06/10/2009   EF 60-65%, Normal-trace findings  . VASCULAR EVALUATION  06/09/2009   No significant extracranial carotid artery stenosis demonstrated. Vertebrals are patent with antegrade flow.   Family History  Problem Relation Age of Onset  . Heart failure Mother   . Heart failure Father   . Cancer Sister    Social History  Substance Use Topics  . Smoking status: Former Smoker    Packs/day: 2.00    Years: 13.00    Types: Cigarettes    Quit date: 03/15/1976  . Smokeless tobacco: Never Used  . Alcohol use No    Review of  Systems  All other systems reviewed and are negative.   Allergies  Niaspan [niacin er]  Home Medications   Prior to Admission medications   Medication Sig Start Date End Date Taking? Authorizing Provider  aspirin 81 MG tablet Take 81 mg by mouth daily.      [provider]  metoprolol (LOPRESSOR) 50 MG tablet Take 75-100 twice a day as directed by Dr Tresa Endo 01/02/17   Lennette Bihari, MD  omeprazole (PRILOSEC) 20 MG capsule TAKE ONE CAPSULE BY MOUTH TWICE DAILY 07/12/16   Donita Brooks, MD  ramipril (ALTACE) 10 MG capsule TAKE ONE CAPSULE BY MOUTH ONCE DAILY 04/18/16   Donita Brooks, MD  rosuvastatin (CRESTOR) 40 MG tablet Take 1 tablet (40 mg total) by mouth daily. 02/25/17 05/26/17  Lennette Bihari, MD   Meds Ordered and Administered this Visit   Medications  Tdap (BOOSTRIX) injection 0.5 mL (0.5 mLs Intramuscular Given 03/05/17 1353)    BP 121/70 (BP Location: Left Arm)   Pulse (!) 57   Temp 97.9 F (36.6 C) (Oral)   Resp 18   SpO2 97%  No data found.   Physical Exam  Constitutional: He is oriented to person, place, and time. He appears well-developed and well-nourished.  Musculoskeletal: He exhibits tenderness. He exhibits no deformity.  superficial laceration palmar and dorsal finger, no gapping,  approx 6mm each  Neurological: He is alert and oriented to person,  place, and time.  Skin: Skin is warm.  Psychiatric: He has a normal mood and affect.  Nursing note and vitals reviewed.   Urgent Care Course     Procedures (including critical care time)  Labs Review Labs Reviewed - No data to display  Imaging Review Dg Finger Middle Right  Result Date: 03/05/2017 CLINICAL DATA:  Pt had right middle finger caught between 2 garage door panels earlier today. No previous injury to area. Laceration on palmar surface of DiP joint another laceration on dorsum of DiP joint. Nondiabetic. EXAM: RIGHT MIDDLE FINGER 2+V COMPARISON:  None. FINDINGS: No fracture line or  displaced fracture fragment seen. Overall osseous alignment is normal. At least mild edema within the surrounding soft tissues. IMPRESSION: No fracture or dislocation. Electronically Signed   By: Bary RichardStan  Maynard M.D.   On: 03/05/2017 13:57     Visual Acuity Review  Right Eye Distance:   Left Eye Distance:   Bilateral Distance:    Right Eye Near:   Left Eye Near:    Bilateral Near:         MDM no fracture,  Pt given tetanus,     1. Crush injury to finger, initial encounter   2. Crush injury    An After Visit Summary was printed and given to the patient.     Elson AreasSofia, Brace Welte K, [place]-C 03/05/17 1627

## 2017-03-29 DIAGNOSIS — R3912 Poor urinary stream: Secondary | ICD-10-CM | POA: Diagnosis not present

## 2017-03-29 DIAGNOSIS — R310 Gross hematuria: Secondary | ICD-10-CM | POA: Diagnosis not present

## 2017-04-08 ENCOUNTER — Telehealth: Payer: Self-pay | Admitting: Cardiovascular Disease

## 2017-04-08 NOTE — Telephone Encounter (Signed)
Patient wife Malachi Bonds(Gloria) calling, would like to verify that patient needs blood work completed.

## 2017-04-08 NOTE — Telephone Encounter (Signed)
Returned call to wife (EC)-states patient thought he was suppose to get blood work prior to appointment with Dr. Tresa EndoKelly and he has not completed it so he was wondering if he needed to cancel. Advised to keep appt tomorrow although we wont have results to go over at appt and to get FASTING blood work this week at convenience.    Verbalized understanding.

## 2017-04-09 ENCOUNTER — Ambulatory Visit (INDEPENDENT_AMBULATORY_CARE_PROVIDER_SITE_OTHER): Payer: Medicare Other | Admitting: Cardiovascular Disease

## 2017-04-09 ENCOUNTER — Encounter: Payer: Self-pay | Admitting: Cardiovascular Disease

## 2017-04-09 VITALS — BP 113/68 | HR 60 | Ht 69.0 in | Wt 213.4 lb

## 2017-04-09 DIAGNOSIS — I10 Essential (primary) hypertension: Secondary | ICD-10-CM | POA: Diagnosis not present

## 2017-04-09 DIAGNOSIS — I2510 Atherosclerotic heart disease of native coronary artery without angina pectoris: Secondary | ICD-10-CM | POA: Diagnosis not present

## 2017-04-09 DIAGNOSIS — E785 Hyperlipidemia, unspecified: Secondary | ICD-10-CM

## 2017-04-09 DIAGNOSIS — Z951 Presence of aortocoronary bypass graft: Secondary | ICD-10-CM | POA: Diagnosis not present

## 2017-04-09 DIAGNOSIS — E669 Obesity, unspecified: Secondary | ICD-10-CM | POA: Diagnosis not present

## 2017-04-09 DIAGNOSIS — G4733 Obstructive sleep apnea (adult) (pediatric): Secondary | ICD-10-CM | POA: Diagnosis not present

## 2017-04-09 NOTE — Patient Instructions (Signed)
Your physician recommends that you return for lab work in: 2 weeks fasting.  Your physician wants you to follow-up in: 6 months or sooner if needed. You will receive a reminder letter in the mail two months in advance. If you don't receive a letter, please call our office to schedule the follow-up appointment.  If you need a refill on your cardiac medications before your next appointment, please call your pharmacy.

## 2017-04-09 NOTE — Progress Notes (Signed)
Patient ID: Don Aguilar, male   DOB: 1950/12/30, 66 y.o.   MRN: [id number]     HPI: Don Aguilar is a 66 y.o. male who presents to the office for a 3 month followup cardiology evaluation.  Don Aguilar was found to have severe multivessel CAD in October 2010.  He underwent CABG revascularization surgery by Dr. Nils Pyle with the LIMA to the LAD, a vein to the first diagonal, vein to the circumflex marginal, and vein to the distal RCA.  Additional problems include obesity, hypertension, as well as mixed hyperlipidemia.  He also has a history of mild obstructive sleep apnea which was diagnosed 2 years ago. He did undergo a CPAP titration trial at 11 cm water pressure was recommended but he never followed up and had CPAP initiation. His AHI was 8.5 per hour and his RDI was 25.4 per hour. During REM sleep AHI was 10.8 and RDI 23.1.  When saw him in February 2015 he was working as a Air traffic controller at [employer].  Since I last saw him, he retired 2 months ago.  He denies any episodes of chest pain.  However, he notes shortness of breath particularly with walking up steps.  When I last saw him, he had been on high-dose beta blocker therapy with metoprolol  150 mg bid which had controlled his palpitations.  Apparently, the last time he filled this prescription it appears that inadvertently he was given a prescription for only metoprolol 25 mg twice a day.  On his reduced dose, he has noticed more palpitations particularly at night.  He denies any presyncope.   He denies significant hypersomnolence.  He typically goes to bed at 10 PM and wakes up at 4:30 AM.  He does snore.    Since I last saw him in May 2015.  He has felt well.  At that time, I further titrated metoprolol since he had inadvertently been on the lower dose.  He is now on 75 mg twice a day.  He has noted resolution of his prior palpitations.  He underwent an echo Doppler study on 01/31/2017.  This showed an EF of 60-65%.  Normal diastolic  function.  Atrial septal thickness was increased, consistent with lipomatous hypertrophy.  He is not very active.  He walks approximate quarter mile a day at work but does not routinely walk for exercise.  He denies any episodes of chest pain.  He denies presyncope or syncope.   Past Medical History:  Diagnosis Date  . Coronary artery disease   . GERD (gastroesophageal reflux disease)   . Hyperlipidemia   . Hypertension 2010    Past Surgical History:  Procedure Laterality Date  . CARDIAC CATHETERIZATION  06/08/2009   No intervention - recommend CABG  . CARDIOVASCULAR STRESS TEST  05/30/2012   Small area of possible ischemia in basal septal wall. ECG negative for ischemia, but frequent PVCs continued from mid to peak exercise with  Bigeminy pattern.  . CORONARY ARTERY BYPASS GRAFT  06/10/2009   x4. LIMA to LAD, SVG to first diag, SVG to circumflex, SVG to RCA. Endoscopic vein harvest from right thigh and right lower leg.  Marland Kitchen HAND TENDON SURGERY  left hand, 1972  . TEE WITHOUT CARDIOVERSION  06/10/2009   EF 60-65%, Normal-trace findings  . VASCULAR EVALUATION  06/09/2009   No significant extracranial carotid artery stenosis demonstrated. Vertebrals are patent with antegrade flow.    Allergies  Allergen Reactions  . Niaspan [Niacin Er] Itching  Lower legs    Current Outpatient Prescriptions  Medication Sig Dispense Refill  . aspirin 81 MG tablet Take 81 mg by mouth daily.      . finasteride (PROSCAR) 5 MG tablet Take 5 mg by mouth daily.    . metoprolol (LOPRESSOR) 50 MG tablet Take 75-100 twice a day as directed by Dr Claiborne Billings 60 tablet 6  . omeprazole (PRILOSEC) 20 MG capsule TAKE ONE CAPSULE BY MOUTH TWICE DAILY 60 capsule 11  . ramipril (ALTACE) 10 MG capsule TAKE ONE CAPSULE BY MOUTH ONCE DAILY 30 capsule 11  . rosuvastatin (CRESTOR) 40 MG tablet Take 1 tablet (40 mg total) by mouth daily. 90 tablet 3   No current facility-administered medications for this visit.     Social  History   Social History  . Marital status: Married    Spouse name: N/A  . Number of children: N/A  . Years of education: N/A   Occupational History  . Not on file.   Social History Main Topics  . Smoking status: Former Smoker    Packs/day: 2.00    Years: 13.00    Types: Cigarettes    Quit date: 03/15/1976  . Smokeless tobacco: Never Used  . Alcohol use No  . Drug use: No  . Sexual activity: Not on file   Other Topics Concern  . Not on file   Social History Narrative  . No narrative on file   Socially he just retired from [employer] as a Air traffic controller oftentimes building stages.Marland Kitchen He is married has 3 children, 2 grandchildren.  Family History  Problem Relation Age of Onset  . Heart failure Mother   . Heart failure Father   . Cancer Sister    ROS General: Negative; No fevers, chills, or night sweats;  HEENT: Negative; No changes in vision or hearing, sinus congestion, difficulty swallowing Pulmonary: Negative; No cough, wheezing, shortness of breath, hemoptysis Cardiovascular: see HPI GI: Negative; No nausea, vomiting, diarrhea, or abdominal pain GU: Negative; No dysuria, hematuria, or difficulty voiding Musculoskeletal: Negative; no myalgias, joint pain, or weakness Hematologic/Oncology: Negative; no easy bruising, bleeding Endocrine: Negative; no heat/cold intolerance; no diabetes Neuro: Negative; no changes in balance, headaches Skin: Negative; No rashes or skin lesions Psychiatric: Negative; No behavioral problems, depression Sleep: Positive for mild sleep apnea; No snoring, daytime sleepiness, hypersomnolence, bruxism, restless legs, hypnogognic hallucinations, no cataplexy Other comprehensive 14 point system review is negative.  PE BP 113/68   Pulse 60   Ht 5' 9"  (1.753 m)   Wt 213 lb 6.4 oz (96.8 kg)   BMI 31.51 kg/m    Repeat blood pressure 128/82  Wt Readings from Last 3 Encounters:  04/09/17 213 lb 6.4 oz (96.8 kg)  01/02/17 213  lb (96.6 kg)  04/05/16 226 lb (102.5 kg)   General: Alert, oriented, no distress.  Skin: normal turgor, no rashes, warm and dry HEENT: Normocephalic, atraumatic. Pupils equal round and reactive to light; sclera anicteric; extraocular muscles intact; no xanthelasmas Nose without nasal septal hypertrophy Mouth/Parynx benign; Mallinpatti scale 3 Neck: No JVD, no carotid bruits; normal carotid upstroke Lungs: clear to ausculatation and percussion; no wheezing or rales Chest wall: without tenderness to palpitation Heart: PMI not displaced, RRR, s1 s2 normal, 1/6 systolic murmur, no diastolic murmur, no rubs, gallops, thrills, or heaves Abdomen: soft, nontender; no hepatosplenomehaly, BS+; abdominal aorta nontender and not dilated by palpation. Back: no CVA tenderness Pulses 2+ Musculoskeletal: full range of motion, normal strength, no joint deformities Extremities:  no clubbing cyanosis or edema, Homan's sign negative  Neurologic: grossly nonfocal; Cranial nerves grossly wnl Psychologic: Normal mood and affect   ECG (independently read by me): Normal sinus rhythm at 60 bpm.  Anterolateral T-wave changes  May 2018 ECG (independently read by me): Normal sinus rhythm at 77 bpm.  Nonspecific T changes precordial.  Normal intervals.  November 2016 ECG (independently read by me): Sinus bradycardia 52 bpm.  Previously noted T-wave abnormality inferolaterally  February 2015 ECG (independently read by me): Normal sinus rhythm at 66 beats per minute. Nonspecific T. wave abnormality.  LABS:  BMP Latest Ref Rng & Units 01/30/2017 04/05/2016 06/02/2015  Glucose 65 - 99 mg/dL 85 101(H) 74  BUN 7 - 25 mg/dL 17 18 20   Creatinine 0.70 - 1.25 mg/dL 1.08 1.13 1.06  Sodium 135 - 146 mmol/L 140 141 137  Potassium 3.5 - 5.3 mmol/L 4.1 4.0 4.3  Chloride 98 - 110 mmol/L 105 108 102  CO2 20 - 31 mmol/L 25 22 28   Calcium 8.6 - 10.3 mg/dL 8.8 9.1 8.8   Hepatic Function Latest Ref Rng & Units 01/30/2017 04/05/2016  06/02/2015  Total Protein 6.1 - 8.1 g/dL 6.6 6.3 6.4  Albumin 3.6 - 5.1 g/dL 3.9 4.0 3.9  AST 10 - 35 U/L 18 20 20   ALT 9 - 46 U/L 17 21 16   Alk Phosphatase 40 - 115 U/L 95 90 84  Total Bilirubin 0.2 - 1.2 mg/dL 0.5 0.3 0.3   CBC Latest Ref Rng & Units 01/30/2017 04/05/2016 06/02/2015  WBC 3.8 - 10.8 K/uL 7.6 7.7 8.3  Hemoglobin 13.2 - 17.1 g/dL 13.9 13.9 13.9  Hematocrit 38.5 - 50.0 % 42.0 41.6 41.8  Platelets 140 - 400 K/uL 151 159 182   Lab Results  Component Value Date   MCV 90.9 01/30/2017   MCV 89.5 04/05/2016   MCV 91.1 06/02/2015   Lab Results  Component Value Date   TSH 2.76 01/30/2017   Lab Results  Component Value Date   HGBA1C  06/10/2009    5.9 (NOTE) The ADA recommends the following therapeutic goal for glycemic control related to Hgb A1c measurement: Goal of therapy: <6.5 Hgb A1c  Reference: American Diabetes Association: Clinical Practice Recommendations 2010, Diabetes Care, 2010, 33: (Suppl  1).   Lipid Panel     Component Value Date/Time   CHOL 164 01/30/2017 0930   TRIG 235 (H) 01/30/2017 0930   HDL 26 (L) 01/30/2017 0930   CHOLHDL 6.3 (H) 01/30/2017 0930   VLDL 47 (H) 01/30/2017 0930   LDLCALC 91 01/30/2017 0930   RADIOLOGY: No results found.  IMPRESSION:  1. Hyperlipidemia with target LDL less than 70   2. Coronary artery disease involving native coronary artery of native heart without angina pectoris   3. Hx of CABG   4. Essential hypertension   5. OSA (obstructive sleep apnea)   6. Obesity (BMI 30.0-34.9)     ASSESSMENT AND PLAN: Mr. Jhostin Epps is a 66 year old African American male who has a history of moderate obesity and in October 2010 was found to have severe multivessel CAD after he had developed exertional chest pain symptomatology suggestive of ischemia. He underwent CABG surgery due to severe multivessel CAD.  A nuclear perfusion study in October 2013 showed mild attenuation artifact. He did develop ectopy but his beta blocker was  held for this study.  In the past he had been on metoprolol as high as 150 g twice a day, but when he was seen  several months ago.  He was inadvertently only taking 25 mg twice a day after his prescription was renewed.  I have subtotally further titrated his metoprolol and is now taking 50-75 mg twice a day.  His palpitations have resolved.  I reviewed his echo Doppler study which shows both normal systolic and diastolic function.  I am recommending follow-up laboratory particularly with reference to his lipid studies, which were abnormal in June.  He is now on rosuvastatin 40 mg daily.  An additional medication may be necessary if his lab has not significantly improved.  His blood pressure is stable.  He's not having anginal symptoms.  He is mildly obese and weight loss and increased exercise was recommended.  I will contact him regarding his laboratory.  I'll see him in 6 months for reevaluation. Time spent: 25 minutes Troy Sine, MD, [facility]  04/11/2017 8:08 PM

## 2017-04-25 DIAGNOSIS — I10 Essential (primary) hypertension: Secondary | ICD-10-CM | POA: Diagnosis not present

## 2017-04-25 DIAGNOSIS — E785 Hyperlipidemia, unspecified: Secondary | ICD-10-CM | POA: Diagnosis not present

## 2017-04-25 LAB — COMPREHENSIVE METABOLIC PANEL
ALT: 15 IU/L (ref 0–44)
AST: 16 IU/L (ref 0–40)
Albumin/Globulin Ratio: 1.7 (ref 1.2–2.2)
Albumin: 4.1 g/dL (ref 3.6–4.8)
Alkaline Phosphatase: 84 IU/L (ref 39–117)
BUN/Creatinine Ratio: 17 (ref 10–24)
BUN: 20 mg/dL (ref 8–27)
Bilirubin Total: 0.4 mg/dL (ref 0.0–1.2)
CO2: 25 mmol/L (ref 20–29)
Calcium: 8.8 mg/dL (ref 8.6–10.2)
Chloride: 104 mmol/L (ref 96–106)
Creatinine, Ser: 1.17 mg/dL (ref 0.76–1.27)
GFR calc Af Amer: 75 mL/min/{1.73_m2} (ref >59)
GFR calc non Af Amer: 65 mL/min/{1.73_m2} (ref >59)
Globulin, Total: 2.4 g/dL (ref 1.5–4.5)
Glucose: 90 mg/dL (ref 65–99)
Potassium: 4.1 mmol/L (ref 3.5–5.2)
Sodium: 143 mmol/L (ref 134–144)
Total Protein: 6.5 g/dL (ref 6.0–8.5)

## 2017-04-25 LAB — LIPID PANEL
Chol/HDL Ratio: 2.8 ratio (ref 0.0–5.0)
Cholesterol, Total: 88 mg/dL — ABNORMAL LOW (ref 100–199)
HDL: 31 mg/dL — ABNORMAL LOW (ref >39)
LDL Calculated: 37 mg/dL (ref 0–99)
Triglycerides: 101 mg/dL (ref 0–149)
VLDL Cholesterol Cal: 20 mg/dL (ref 5–40)

## 2017-05-01 ENCOUNTER — Other Ambulatory Visit: Payer: Self-pay | Admitting: Cardiovascular Disease

## 2017-05-01 NOTE — Telephone Encounter (Signed)
REFILL 

## 2017-05-02 ENCOUNTER — Telehealth: Payer: Self-pay | Admitting: Cardiovascular Disease

## 2017-05-02 MED ORDER — METOPROLOL TARTRATE 50 MG PO TABS: ORAL_TABLET | ORAL | 3 refills | Status: DC

## 2017-05-02 NOTE — Telephone Encounter (Signed)
Refill sent to the pharmacy electronically.  

## 2017-05-02 NOTE — Telephone Encounter (Signed)
New message     *STAT* If patient is at the pharmacy, call can be transferred to refill team.   1. Which medications need to be refilled? (please list name of each medication and dose if known) metoprolol 50 mg  2. Which pharmacy/location (including street and city if local pharmacy) is medication to be sent to? [facility] on [address]  3. Do they need a 30 day or 90 day supply? 30 day

## 2017-05-06 ENCOUNTER — Other Ambulatory Visit: Payer: Self-pay | Admitting: Family Medicine

## 2017-07-16 ENCOUNTER — Other Ambulatory Visit: Payer: Self-pay | Admitting: Family Medicine

## 2017-07-25 ENCOUNTER — Other Ambulatory Visit: Payer: Self-pay | Admitting: Family Medicine

## 2017-07-25 DIAGNOSIS — J209 Acute bronchitis, unspecified: Secondary | ICD-10-CM

## 2017-07-25 MED ORDER — ALBUTEROL SULFATE HFA 108 (90 BASE) MCG/ACT IN AERS: 2.0000 | INHALATION_SPRAY | Freq: Four times a day (QID) | RESPIRATORY_TRACT | 0 refills | Status: DC | PRN

## 2017-07-26 ENCOUNTER — Encounter: Payer: Self-pay | Admitting: Family Medicine

## 2017-07-26 ENCOUNTER — Other Ambulatory Visit: Payer: Self-pay

## 2017-07-26 ENCOUNTER — Ambulatory Visit (INDEPENDENT_AMBULATORY_CARE_PROVIDER_SITE_OTHER): Payer: Medicare Other | Admitting: Family Medicine

## 2017-07-26 VITALS — BP 120/64 | HR 66 | Temp 98.4°F | Resp 18 | Ht 69.0 in | Wt 221.0 lb

## 2017-07-26 DIAGNOSIS — J209 Acute bronchitis, unspecified: Secondary | ICD-10-CM | POA: Diagnosis not present

## 2017-07-26 MED ORDER — PREDNISONE 20 MG PO TABS: 40.0000 mg | ORAL_TABLET | Freq: Every day | ORAL | 0 refills | Status: DC

## 2017-07-26 MED ORDER — AZITHROMYCIN 250 MG PO TABS: ORAL_TABLET | ORAL | 0 refills | Status: DC

## 2017-07-26 MED ORDER — GUAIFENESIN-CODEINE 100-10 MG/5ML PO SOLN: 5.0000 mL | Freq: Four times a day (QID) | ORAL | 0 refills | Status: DC | PRN

## 2017-07-26 NOTE — Patient Instructions (Signed)
Take the prednisone Take antibiotics  Okay to take the robitussin codiene at bedtime  F/U as needed

## 2017-07-26 NOTE — Progress Notes (Signed)
   Subjective:    Patient ID: Don GriffonClayton Muro, male    DOB: 04/01/1951, 66 y.o.   MRN: [id number]  Patient presents for Illness (x1 week- prodictive cough with thick yellow mucus, cough worsens at night, chest congestion, SOB/ Wheezing)  Pt here with cough with production - yellow mucous, wheezing, cough worse at night for past week. Remote history of smoking, aching in chest with cough  No fever, had chills one day He has underlying CAD, HTN  Albuterol was called in but too expensive   Took 1 day of Nyquil HBP  No GI symptoms   Review Of Systems:  GEN- denies fatigue, fever, weight loss,weakness, recent illness HEENT- denies eye drainage, change in vision, nasal discharge, CVS- denies chest pain, palpitations RESP- denies SOB, +cough, +wheeze ABD- denies N/V, change in stools, abd pain GU- denies dysuria, hematuria, dribbling, incontinence MSK- denies joint pain, muscle aches, injury Neuro- denies headache, dizziness, syncope, seizure activity       Objective:    BP 120/64   Pulse 66   Temp 98.4 F (36.9 C) (Oral)   Resp 18   Ht 5\' 9"  (1.753 m)   Wt 221 lb (100.2 kg)   SpO2 98%   BMI 32.64 kg/m  GEN- NAD, alert and oriented x3 HEENT- PERRL, EOMI, non injected sclera, pink conjunctiva, MMM, oropharynx clear, TM clear bilat no effusion,  No maxillary sinus tenderness, inflammed turbinates,  Nasal drainage  Neck- Supple, +  LAD CVS- RRR, no murmur RESP-scatttered wheeze , improves with cough, +rhonchi EXT- No edema Pulses- Radial 2+         Assessment & Plan:      Problem List Items Addressed This Visit    None    Visit Diagnoses    Acute bronchitis, unspecified organism    -  Primary   Zpak, prednisone, robitussin AC at bedtime, can use HBP during day. He can not afford albuterol, oxygen sat looks good      Note: This dictation was prepared with Dragon dictation along with smaller phrase technology. Any transcriptional errors that result from this process are  unintentional.

## 2017-08-01 ENCOUNTER — Other Ambulatory Visit: Payer: Self-pay | Admitting: Family Medicine

## 2017-10-28 ENCOUNTER — Other Ambulatory Visit: Payer: Self-pay | Admitting: Family Medicine

## 2017-11-29 ENCOUNTER — Ambulatory Visit (HOSPITAL_COMMUNITY)
Admission: EM | Admit: 2017-11-29 | Discharge: 2017-11-29 | Disposition: A | Discharge status: Home / Self Care | Payer: Medicare Other | Attending: Internal Medicine | Admitting: Internal Medicine

## 2017-11-29 ENCOUNTER — Encounter (HOSPITAL_COMMUNITY): Payer: Self-pay | Admitting: Family Medicine

## 2017-11-29 DIAGNOSIS — B029 Zoster without complications: Secondary | ICD-10-CM

## 2017-11-29 DIAGNOSIS — R109 Unspecified abdominal pain: Secondary | ICD-10-CM | POA: Diagnosis not present

## 2017-11-29 MED ORDER — FAMCICLOVIR 500 MG PO TABS: 500.0000 mg | ORAL_TABLET | Freq: Three times a day (TID) | ORAL | 0 refills | Status: AC

## 2017-11-29 MED ORDER — KETOROLAC TROMETHAMINE 60 MG/2ML IM SOLN
INTRAMUSCULAR | Status: AC
  Filled 2017-11-29: qty 2

## 2017-11-29 MED ORDER — KETOROLAC TROMETHAMINE 60 MG/2ML IM SOLN
60.0000 mg | Freq: Once | INTRAMUSCULAR | Status: AC
  Administered 2017-11-29: 60 mg via INTRAMUSCULAR

## 2017-11-29 MED ORDER — PREDNISONE 50 MG PO TABS: 50.0000 mg | ORAL_TABLET | Freq: Every day | ORAL | 0 refills | Status: AC

## 2017-11-29 MED ORDER — HYDROCODONE-ACETAMINOPHEN 5-325 MG PO TABS: 1.0000 | ORAL_TABLET | Freq: Four times a day (QID) | ORAL | 0 refills | Status: DC | PRN

## 2017-11-29 NOTE — Discharge Instructions (Addendum)
An injection of ketorolac (anti-inflammatory/pain reliever) was given at the urgent care today.  The pain you are having and the rash on your back are suggestive of herpes zoster (shingles).  Prescriptions for famciclovir (for shingles), prednisone (steroid, to help with pain), and a small number of Vicodin (for pain) were sent to the pharmacy.  It is okay to take ibuprofen or Aleve with Vicodin.  Anticipate gradual improvement in pain over the next 2-3 weeks.  Rash may become a little worse, with blisters and then scabs, before it starts to resolve.  Follow-up with your primary care provider in several days if not improving as expected.

## 2017-11-29 NOTE — ED Provider Notes (Signed)
[facility]    CSN: [id number] Arrival date & time: 11/29/17  1546     History   Chief Complaint Chief Complaint  Patient presents with  . Flank Pain    HPI Don Aguilar is a 67 y.o. male.   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  Since then, he has had a lot of discomfort in his right flank, around the lower ribs.  Wife observed a 'bruise' there today, and sent him for more evaluation.  No history of trauma, did not fall.  Has not been coughing.  He is not particularly breathless, no unusual leg pain or swelling.  Pain is worse with position change, worse with movement, and sensitive to touch.    HPI  Past Medical History:  Diagnosis Date  . Coronary artery disease   . GERD (gastroesophageal reflux disease)   . Hyperlipidemia   . Hypertension 2010    Patient Active Problem List   Diagnosis Date Noted  . Exertional dyspnea 06/28/2015  . CABG 05/2009 10/19/2013  . HTN (hypertension) 10/19/2013  . Hyperlipidemia with target LDL less than 70 10/19/2013  . OSA (obstructive sleep apnea) 10/19/2013  . Obesity (BMI 30.0-34.9) 10/19/2013    Past Surgical History:  Procedure Laterality Date  . CARDIAC CATHETERIZATION  06/08/2009   No intervention - recommend CABG  . CARDIOVASCULAR STRESS TEST  05/30/2012   Small area of possible ischemia in basal septal wall. ECG negative for ischemia, but frequent PVCs continued from mid to peak exercise with  Bigeminy pattern.  . CORONARY ARTERY BYPASS GRAFT  06/10/2009   x4. LIMA to LAD, SVG to first diag, SVG to circumflex, SVG to RCA. Endoscopic vein harvest from right thigh and right lower leg.  Marland Kitchen HAND TENDON SURGERY  left hand, 1972  . TEE WITHOUT CARDIOVERSION  06/10/2009   EF 60-65%, Normal-trace findings  . VASCULAR EVALUATION  06/09/2009   No significant extracranial carotid artery stenosis demonstrated. Vertebrals are patent with antegrade flow.       Home Medications     Prior to Admission medications   Medication Sig Start Date End Date Taking? Authorizing Provider  albuterol (PROVENTIL HFA;VENTOLIN HFA) 108 (90 Base) MCG/ACT inhaler Inhale 2 puffs into the lungs every 6 (six) hours as needed for wheezing or shortness of breath. Patient not taking: Reported on 07/26/2017 07/25/17   Donita Brooks, MD  aspirin 81 MG tablet Take 81 mg by mouth daily.      [provider]  famciclovir (FAMVIR) 500 MG tablet Take 1 tablet (500 mg total) by mouth 3 (three) times daily for 10 days. 11/29/17 12/09/17  Isa Rankin, MD  finasteride (PROSCAR) 5 MG tablet Take 5 mg by mouth daily.    [provider]  HYDROcodone-acetaminophen (NORCO/VICODIN) 5-325 MG tablet Take 1 tablet by mouth 4 (four) times daily as needed. 11/29/17   Isa Rankin, MD  metoprolol tartrate (LOPRESSOR) 50 MG tablet TAKE 1 & 1/2 TO 2 (ONE & ONE-HALF TO TWO) TABLETS BY MOUTH TWICE DAILY AS NEEDED 05/02/17   Lennette Bihari, MD  omeprazole (PRILOSEC) 20 MG capsule TAKE ONE CAPSULE BY MOUTH TWICE DAILY 07/16/17   Donita Brooks, MD  predniSONE (DELTASONE) 50 MG tablet Take 1 tablet (50 mg total) by mouth daily for 5 days. 11/29/17 12/04/17  Isa Rankin, MD  ramipril (ALTACE) 10 MG capsule Take 1 capsule (10 mg total)  by mouth daily. 10/28/17   Donita BrooksPickard, Warren T, MD  rosuvastatin (CRESTOR) 40 MG tablet Take 1 tablet (40 mg total) by mouth daily. 02/25/17 05/26/17  Lennette BihariKelly, Thomas A, MD    Family History Family History  Problem Relation Age of Onset  . Heart failure Mother   . Heart failure Father   . Cancer Sister     Social History Social History   Tobacco Use  . Smoking status: Former Smoker    Packs/day: 2.00    Years: 13.00    Pack years: 26.00    Types: Cigarettes    Last attempt to quit: 03/15/1976    Years since quitting: 41.7  . Smokeless tobacco: Never Used  Substance Use Topics  . Alcohol use: No    Alcohol/week: 0.0 oz  . Drug use: No      Allergies   Niaspan [niacin er]   Review of Systems Review of Systems  All other systems reviewed and are negative.    Physical Exam Triage Vital Signs ED Triage Vitals  Enc Vitals Group     BP 11/29/17 1644 134/80     Pulse Rate 11/29/17 1644 (!) 56     Resp 11/29/17 1644 18     Temp 11/29/17 1644 98.3 F (36.8 C)     Temp src --      SpO2 11/29/17 1644 100 %     Weight --      Height --      Pain Score 11/29/17 1643 10     Pain Loc --    Updated Vital Signs BP 134/80 (BP Location: Left Arm)   Pulse (!) 56   Temp 98.3 F (36.8 C)   Resp 18   SpO2 100%  Physical Exam  Constitutional: He is oriented to person, place, and time. No distress.  Alert, nicely groomed  HENT:  Head: Atraumatic.  Eyes:  Conjugate gaze, no eye redness/drainage  Neck: Neck supple.  Cardiovascular: Normal rate and regular rhythm.  Area of tenderness marked on diagram See photo for rash/bruise  Pulmonary/Chest: No respiratory distress. He has no wheezes. He has no rales.      Lungs clear, symmetric breath sounds.    Abdominal: He exhibits no distension.  Musculoskeletal: Normal range of motion.  Neurological: He is alert and oriented to person, place, and time.  Skin: Skin is warm and dry.  No cyanosis  Nursing note and vitals reviewed.        UC Treatments / Results   EKG None Radiology No results found.  Procedures Procedures (including critical care time)  Medications Ordered in UC Medications  ketorolac (TORADOL) injection 60 mg (60 mg Intramuscular Given 11/29/17 1731)     Final Clinical Impressions(s) / UC Diagnoses   Final diagnoses:  Acute pain associated with herpes zoster   An injection of ketorolac (anti-inflammatory/pain reliever) was given at the urgent care today.  The pain you are having and the rash on your back are suggestive of herpes zoster (shingles).  Prescriptions for famciclovir (for shingles), prednisone (steroid, to help with  pain), and a small number of Vicodin (for pain) were sent to the pharmacy.  It is okay to take ibuprofen or Aleve with Vicodin.  Anticipate gradual improvement in pain over the next 2-3 weeks.  Rash may become a little worse, with blisters and then scabs, before it starts to resolve.  Follow-up with your primary care provider in several days if not improving as expected.  ED Discharge Orders  Ordered    HYDROcodone-acetaminophen (NORCO/VICODIN) 5-325 MG tablet  4 times daily PRN     11/29/17 1730    famciclovir (FAMVIR) 500 MG tablet  3 times daily     11/29/17 1731    predniSONE (DELTASONE) 50 MG tablet  Daily     11/29/17 1732       Controlled Substance Prescriptions [place] Controlled Substance Registry consulted? Yes, I have consulted the [place] Controlled Substances Registry for this patient, and feel the risk/benefit ratio today is favorable for proceeding with this prescription for a controlled substance.   Isa Rankin, MD 12/01/17 2035

## 2017-11-29 NOTE — ED Triage Notes (Signed)
Pt here for muscle spasm in right flank and side area since yesterday.

## 2018-01-18 ENCOUNTER — Other Ambulatory Visit: Payer: Self-pay | Admitting: Cardiovascular Disease

## 2018-01-21 ENCOUNTER — Other Ambulatory Visit: Payer: Self-pay

## 2018-01-21 MED ORDER — ROSUVASTATIN CALCIUM 40 MG PO TABS: 40.0000 mg | ORAL_TABLET | Freq: Every day | ORAL | 0 refills | Status: DC

## 2018-01-21 NOTE — Telephone Encounter (Signed)
Rx(s) sent to pharmacy electronically.  

## 2018-01-21 NOTE — Telephone Encounter (Signed)
Rx sent to pharmacy   

## 2018-05-01 ENCOUNTER — Other Ambulatory Visit: Payer: Self-pay | Admitting: Family Medicine

## 2018-05-01 ENCOUNTER — Other Ambulatory Visit: Payer: Self-pay | Admitting: Cardiovascular Disease

## 2018-05-05 ENCOUNTER — Other Ambulatory Visit: Payer: Self-pay | Admitting: Cardiovascular Disease

## 2018-08-06 ENCOUNTER — Other Ambulatory Visit: Payer: Self-pay | Admitting: Cardiovascular Disease

## 2018-08-18 ENCOUNTER — Other Ambulatory Visit: Payer: Self-pay | Admitting: Cardiovascular Disease

## 2018-09-04 ENCOUNTER — Other Ambulatory Visit: Payer: Self-pay | Admitting: Cardiovascular Disease

## 2018-09-04 NOTE — Telephone Encounter (Signed)
Left message for patient to schedule follow up appt with Dr Tresa Endo. Last appointment was 04/09/2017.

## 2018-09-05 ENCOUNTER — Other Ambulatory Visit: Payer: Self-pay | Admitting: Cardiovascular Disease

## 2018-09-05 MED ORDER — METOPROLOL TARTRATE 50 MG PO TABS: 75.0000 mg | ORAL_TABLET | Freq: Two times a day (BID) | ORAL | 0 refills | Status: DC

## 2018-09-05 NOTE — Telephone Encounter (Signed)
Rx request sent to pharmacy.  

## 2018-09-05 NOTE — Telephone Encounter (Signed)
°*  STAT* If patient is at the pharmacy, call can be transferred to refill team.   1. Which medications need to be refilled? (please list name of each medication and dose if known)  Metoprolol- please call in today if possible please 2. Which pharmacy/location (including street and city if local pharmacy) is medication to be sent to? [facility] [phone]  3. Do they need a 30 day or 90 day supply?  Need enough his appt 11-28-18

## 2018-09-14 ENCOUNTER — Other Ambulatory Visit: Payer: Self-pay | Admitting: Family Medicine

## 2018-09-18 ENCOUNTER — Other Ambulatory Visit: Payer: Self-pay | Admitting: Family Medicine

## 2018-09-25 ENCOUNTER — Other Ambulatory Visit: Payer: Self-pay | Admitting: Cardiovascular Disease

## 2018-10-21 ENCOUNTER — Telehealth: Payer: Self-pay | Admitting: Cardiovascular Disease

## 2018-10-21 MED ORDER — METOPROLOL TARTRATE 50 MG PO TABS: 75.0000 mg | ORAL_TABLET | Freq: Two times a day (BID) | ORAL | 0 refills | Status: DC

## 2018-10-21 NOTE — Telephone Encounter (Signed)
New Message           *STAT* If patient is at the pharmacy, call can be transferred to refill team.   1. Which medications need to be refilled? (please list name of each medication and dose if known) Metoprolol 50mg   2. Which pharmacy/location (including street and city if local pharmacy) is medication to be sent to? [facility]  [address]  3. Do they need a 30 day or 90 day supply? 90

## 2018-10-21 NOTE — Telephone Encounter (Signed)
Metoprolol sent in per pt request.. pt to keep OV 11/2018.

## 2018-11-06 ENCOUNTER — Other Ambulatory Visit: Payer: Self-pay | Admitting: *Deleted

## 2018-11-06 MED ORDER — METOPROLOL TARTRATE 50 MG PO TABS: 75.0000 mg | ORAL_TABLET | Freq: Two times a day (BID) | ORAL | 0 refills | Status: DC

## 2018-11-07 ENCOUNTER — Other Ambulatory Visit: Payer: Self-pay | Admitting: Family Medicine

## 2018-11-10 ENCOUNTER — Other Ambulatory Visit: Payer: Self-pay | Admitting: Family Medicine

## 2018-11-10 ENCOUNTER — Other Ambulatory Visit: Payer: Self-pay | Admitting: Cardiovascular Disease

## 2018-11-10 ENCOUNTER — Other Ambulatory Visit: Payer: Self-pay

## 2018-11-12 ENCOUNTER — Other Ambulatory Visit: Payer: Self-pay | Admitting: Family Medicine

## 2018-11-12 MED ORDER — RAMIPRIL 10 MG PO CAPS: 10.0000 mg | ORAL_CAPSULE | Freq: Every day | ORAL | 1 refills | Status: DC

## 2018-11-24 ENCOUNTER — Telehealth: Payer: Self-pay | Admitting: Cardiovascular Disease

## 2018-11-24 NOTE — Telephone Encounter (Signed)
° ° °  Patient's spouse calling to clarify the visit type for 4/3. Should a virtual visit be arranged. Please call

## 2018-11-24 NOTE — Telephone Encounter (Signed)
Spoke with pt, he is due for a 6 month follow up. He is not having problems and he has a smart phone if virtual visit is available. Explained dr [facility] nurse will be back in touch regarding appt. Patient voiced understanding

## 2018-11-24 NOTE — Telephone Encounter (Signed)
Attempted to contact patient back to discuss converting appointment to virtual visit on Friday. Unable to leave message, will try again at a later time.

## 2018-11-26 NOTE — Telephone Encounter (Signed)
Called patient, did move visit to a virtual video visit for the same time on Friday afternoon.   Patient verbalized understanding.

## 2018-11-27 ENCOUNTER — Telehealth: Payer: Self-pay | Admitting: Cardiovascular Disease

## 2018-11-27 NOTE — Telephone Encounter (Signed)
Spoke to pt. Had to get him back on MyChart to be able to view message from [facility]. Talked pt through downloading Webex. Spent 40 minutes talking with pt.  Patient does have a BP cuff and weight scale.

## 2018-11-28 ENCOUNTER — Ambulatory Visit: Payer: Medicare Other | Admitting: Cardiovascular Disease

## 2018-11-28 ENCOUNTER — Telehealth (INDEPENDENT_AMBULATORY_CARE_PROVIDER_SITE_OTHER): Payer: Medicare Other | Admitting: Cardiovascular Disease

## 2018-11-28 ENCOUNTER — Encounter: Payer: Self-pay | Admitting: Cardiovascular Disease

## 2018-11-28 DIAGNOSIS — E669 Obesity, unspecified: Secondary | ICD-10-CM

## 2018-11-28 DIAGNOSIS — G4733 Obstructive sleep apnea (adult) (pediatric): Secondary | ICD-10-CM

## 2018-11-28 DIAGNOSIS — E785 Hyperlipidemia, unspecified: Secondary | ICD-10-CM | POA: Diagnosis not present

## 2018-11-28 DIAGNOSIS — I2510 Atherosclerotic heart disease of native coronary artery without angina pectoris: Secondary | ICD-10-CM | POA: Diagnosis not present

## 2018-11-28 DIAGNOSIS — I10 Essential (primary) hypertension: Secondary | ICD-10-CM | POA: Diagnosis not present

## 2018-11-28 NOTE — Progress Notes (Signed)
Virtual Visit via Video Note     Evaluation Performed:  Follow-up visit  This visit type was conducted due to national recommendations for restrictions regarding the COVID-19 Pandemic (e.g. social distancing).  This format is felt to be most appropriate for this patient at this time.  All issues noted in this document were discussed and addressed.  No physical exam was performed (except for noted visual exam findings with Video Visits).  Please refer to the patient's chart (MyChart message for video visits and phone note for telephone visits) for the patient's consent to telehealth for [facility]  Verbal consent was obtained with the patient to proceed with this visit and bill insurance.  Date:  11/28/2018   ID:  Don Aguilar, DOB Aug 21, 1951, MRN [id number]  Patient Location: [address] [facility] [place] [address]   Provider location:   [facility] [place]  PCP:  Donita Brooks, MD  Cardiologist:  No primary care provider on file. Nicki Guadalajara, MD Electrophysiologist:  None   Chief Complaint:  F/U last seen 04/09/2017  History of Present Illness:    Don Aguilar is a 68 y.o. male who presents via audio/video conferencing for a telehealth visit today.    The patient does not have symptoms concerning for COVID-19 infection (fever, chills, cough, or new SHORTNESS OF BREATH).   [name] was found to have severe multivessel CAD in October 2010.  He underwent CABG revascularization surgery by Dr. Morton Peters with the LIMA to the LAD, a vein to the first diagonal, vein to the circumflex marginal, and vein to the distal RCA.  Additional problems include obesity, hypertension, as well as mixed hyperlipidemia.  He also has a history of mild obstructive sleep apnea. He did undergo a CPAP titration trial and 11 cm water pressure was recommended but he never followed up and had CPAP initiation. His AHI was 8.5 per hour and his RDI was 25.4 per hour. During REM sleep AHI was  10.8 and RDI 23.1.  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  He denied any episodes of chest pain.  However, he notes shortness of breath particularly with walking up steps.  When I last saw him, he had been on high-dose beta blocker therapy with metoprolol  150 mg bid which had controlled his palpitations.  Apparently, the last time he filled this prescription it appears that inadvertently he was given a prescription for only metoprolol 25 mg twice a day.  On his reduced dose, he has noticed more palpitations particularly at night.  He denies any presyncope.   He denies significant hypersomnolence.  He typically goes to bed at 10 PM and wakes up at 4:30 AM.  He does snore.    Since I  saw him in May 2015 he had felt well.  At that time, I further titrated metoprolol since he had inadvertently been on the lower dose.  When seen in August 2018 he was now on 75 mg twice a day.  He has noted resolution of his prior palpitations.  He underwent an echo Doppler study on 01/31/2017.  This showed an EF of 60-65%, normal diastolic function.  Atrial septal thickness was increased, consistent with lipomatous hypertrophy.  He is not very active.  He walks approximate quarter mile a day at work but does not routinely walk for exercise.   Since I last saw him in August 2018 he states that he  has done fairly well.  He remains active around the house but has not been routinely exercising.  However he does walk his dog daily.  He denies any episodes of recurrent anginal symptomatology.  He denies any exertional dyspnea but he admits that he has not been exercising.  He recently joined the gym but shortly thereafter the gym is been closed due to the COVID-19 pandemic.  With reference to his sleep, he typically goes to bed around 10 PM but often wakes up around 2 AM and is up for an hour and a half before he is able to get back to sleep again.  He has nocturia 2 times per  night.  At times he takes an occasional nap.  His sleep is somewhat nonrestorative.  He never followed up with previous CPAP therapy following his titration many years ago.  He believes the palpitations are controlled with his current dose of metoprolol.  His blood presssure is typically normal on his Remeron 40 mg in addition to metoprolol 75 mg twice a day.  He continues to be on rosuvastatin 40 mg daily for hyperlipidemia.  In August 2018 his LDL has improved with the increase rosuvastatin dose from 20 to 40 mg with improvement in LDL from 91 down to 37. He presents for this telemedicine evaluation  Prior CV studies:   The following studies were reviewed today:   ECHO Study Conclusions 01/31/2017  - Left ventricle: The cavity size was normal. There was moderate   concentric hypertrophy. Systolic function was normal. The   estimated ejection fraction was in the range of 60% to 65%. Wall   motion was normal; there were no regional wall motion   abnormalities. Left ventricular diastolic function parameters   were normal. - Atrial septum: There was increased thickness of the septum,   consistent with lipomatous hypertrophy.  Past Medical History:  Diagnosis Date   Coronary artery disease    GERD (gastroesophageal reflux disease)    Hyperlipidemia    Hypertension 2010   Past Surgical History:  Procedure Laterality Date   CARDIAC CATHETERIZATION  06/08/2009   No intervention - recommend CABG   CARDIOVASCULAR STRESS TEST  05/30/2012   Small area of possible ischemia in basal septal wall. ECG negative for ischemia, but frequent PVCs continued from mid to peak exercise with  Bigeminy pattern.   CORONARY ARTERY BYPASS GRAFT  06/10/2009   x4. LIMA to LAD, SVG to first diag, SVG to circumflex, SVG to RCA. Endoscopic vein harvest from right thigh and right lower leg.   HAND TENDON SURGERY  left hand, 1972   TEE WITHOUT CARDIOVERSION  06/10/2009   EF 60-65%, Normal-trace findings    VASCULAR EVALUATION  06/09/2009   No significant extracranial carotid artery stenosis demonstrated. Vertebrals are patent with antegrade flow.     Current Meds  Medication Sig   albuterol (PROVENTIL HFA;VENTOLIN HFA) 108 (90 Base) MCG/ACT inhaler Inhale 2 puffs into the lungs every 6 (six) hours as needed for wheezing or shortness of breath.   aspirin 81 MG tablet Take 81 mg by mouth daily.     finasteride (PROSCAR) 5 MG tablet Take 5 mg by mouth daily.   HYDROcodone-acetaminophen (NORCO/VICODIN) 5-325 MG tablet Take 1 tablet by mouth 4 (four) times daily as needed.   metoprolol tartrate (LOPRESSOR) 50 MG tablet Take 1.5 tablets (75 mg total) by mouth 2 (two) times daily. Please keep appointment with Dr. Tresa Endo 11/28/2018.   omeprazole (PRILOSEC) 20 MG capsule TAKE 1 CAPSULE  BY MOUTH TWICE DAILY   ramipril (ALTACE) 10 MG capsule Take 1 capsule (10 mg total) by mouth daily.   rosuvastatin (CRESTOR) 40 MG tablet TAKE 1 TABLET BY MOUTH ONCE DAILY .  NEEDS APPOINTMENT FOR FUTURE REFILLS     Allergies:   Niaspan [niacin er]   Social History   Tobacco Use   Smoking status: Former Smoker    Packs/day: 2.00    Years: 13.00    Pack years: 26.00    Types: Cigarettes    Last attempt to quit: 03/15/1976    Years since quitting: 42.7   Smokeless tobacco: Never Used  Substance Use Topics   Alcohol use: No    Alcohol/week: 0.0 standard drinks   Drug use: No     Family Hx: The patient's family history includes Cancer in his sister; Heart failure in his father and mother.  ROS:   Please see the history of present illness.    He notes some mild paresthesias of his fingers.  He admits to nonrestorative sleep and wakes up for hour and a half from 2 to 3:30 AM.  He does take occasional naps but not every day.  He denies any chest pain.  His palpitations are controlled with metoprolol.  He denies PND orthopnea.  He denies significant leg swelling. All other systems reviewed and are  negative.   Labs/Other Tests and Data Reviewed:    Recent Labs: No results found for requested labs within last 8760 hours.   Recent Lipid Panel Lab Results  Component Value Date/Time   CHOL 88 (L) 04/25/2017 08:32 AM   TRIG 101 04/25/2017 08:32 AM   HDL 31 (L) 04/25/2017 08:32 AM   CHOLHDL 2.8 04/25/2017 08:32 AM   CHOLHDL 6.3 (H) 01/30/2017 09:30 AM   LDLCALC 37 04/25/2017 08:32 AM    Wt Readings from Last 3 Encounters:  11/28/18 220 lb (99.8 kg)  07/26/17 221 lb (100.2 kg)  04/09/17 213 lb 6.4 oz (96.8 kg)     Exam:    Vital Signs:  BP 131/84    Pulse 64    Ht 5\' 9"  (1.753 m)    Wt 220 lb (99.8 kg)    BMI 32.49 kg/m    Well nourished, well developed male in no acute distress. Respirations were normal and unlabored.  He is not short of breath. HEENT appeared normal.  He did not appear to have any JVD.  He appears euvolemic.  He has a thick neck.  He has no complaints of shortness of breath or wheezing.  He did not have any chest pain on palpation.  He denies abdominal pain.  He has GERD which is controlled.  He does not have any lower extremity edema.  He is alert oriented with normal cognition.  He states he at times notes some tingling of his fingers.  He moves all extremities well.  ASSESSMENT & PLAN:    1.  CAD/CABG surgery; he was found to have severe multivessel CAD October 2010 and underwent CABG surgery with a LIMA to LAD, vein to first diagonal, vein to circumflex marginal and vein distal RCA.  Presently, no anginal symptomatology on his metoprolol 75 mg twice a day in addition to ACE inhibition.  2.  Hyperlipidemia.  Now on rosuvastatin 40 mg.  Last lipid study in August 2018 following dose titration to 40 mg showed marked benefit with LDL now at 37.  He has not had recent laboratory.  Will up to obtain fasting laboratory.  3.  Hypertension: Blood pressure relatively controlled today on ramipril 10 mg and metoprolol 75 million twice a day.  However, stage I  hypertension is 130/80.  We discussed improve diet, sodium restriction and increased exercise.  4.  Obstructive sleep apnea: Mild on previous evaluation.  Not on therapy.  Discussed possible need for repeat sleep evaluation since it is highly likely that his sleep apnea has progressed.  5. Mild obesity: BMI 32.49.  Weight loss was recommended.  We discussed exercise at least 5 days/week with moderate intensity for at least 30 minutes if at all possible.  He is recently joined a gym but is unable to go due to the COVID-19 distancing requirements.  I have recommended a complete set of follow-up laboratory be obtained once it is safe for him to come to the lab to get laboratory in the fasting state.  COVID-19 Education: The signs and symptoms of COVID-19 were discussed with the patient and how to seek care for testing (follow up with PCP or arrange E-visit).  The importance of social distancing was discussed today.  Patient Risk:   After full review of this patients clinical status, I feel that they are at least moderate risk at this time.  Time:   Today, I have spent 30 minutes with the patient with telehealth technology.     Medication Adjustments/Labs and Tests Ordered: Current medicines are reviewed at length with the patient today.  Concerns regarding medicines are outlined above.  Tests Ordered: No orders of the defined types were placed in this encounter.  Medication Changes: No orders of the defined types were placed in this encounter.   Disposition:  As long as he remains stable follow-up in 1 year.  We will contact him regarding his fasting laboratory once completed.  Signed, Nicki Guadalajara, MD  11/28/2018 3:43 PM    [facility]

## 2018-12-08 ENCOUNTER — Other Ambulatory Visit: Payer: Self-pay | Admitting: Cardiology

## 2018-12-09 ENCOUNTER — Other Ambulatory Visit: Payer: Self-pay

## 2018-12-10 ENCOUNTER — Other Ambulatory Visit: Payer: Self-pay | Admitting: Cardiology

## 2018-12-10 NOTE — Telephone Encounter (Signed)
Lopressor 50 mg refilled. 

## 2019-01-02 ENCOUNTER — Other Ambulatory Visit: Payer: Self-pay | Admitting: Cardiology

## 2019-01-02 NOTE — Telephone Encounter (Signed)
Lopressor 50 mg refilled. 

## 2019-01-16 ENCOUNTER — Other Ambulatory Visit: Payer: Self-pay | Admitting: Family Medicine

## 2019-01-20 ENCOUNTER — Other Ambulatory Visit: Payer: Self-pay | Admitting: Family Medicine

## 2019-01-20 MED ORDER — OMEPRAZOLE 20 MG PO CPDR: 20.0000 mg | DELAYED_RELEASE_CAPSULE | Freq: Two times a day (BID) | ORAL | 5 refills | Status: DC

## 2019-02-04 ENCOUNTER — Other Ambulatory Visit: Payer: Self-pay | Admitting: Pediatric Intensive Care

## 2019-02-04 DIAGNOSIS — R6889 Other general symptoms and signs: Secondary | ICD-10-CM | POA: Diagnosis not present

## 2019-02-04 DIAGNOSIS — Z20822 Contact with and (suspected) exposure to covid-19: Secondary | ICD-10-CM

## 2019-02-06 LAB — NOVEL CORONAVIRUS, NAA: SARS-CoV-2, NAA: NOT DETECTED

## 2019-02-18 ENCOUNTER — Other Ambulatory Visit: Payer: Self-pay | Admitting: Cardiovascular Disease

## 2019-03-25 ENCOUNTER — Telehealth: Payer: Self-pay | Admitting: Family Medicine

## 2019-03-25 NOTE — Telephone Encounter (Signed)
Pt's wife called and he is having episodes of feeling like his heart is beating hard and his skin is clammy feeling. He denies any cp, sob, arm pain. She is not sure if he has a fever as they are not home at the present time. Informed pt's wife if he develops CP, SOB or arm pain to go to the ER. She verbalizes understanding and apt made for tomorrow. Offered apt for today and pt refused stating that they would wait to see Dr. Dennard Schaumann.

## 2019-03-26 ENCOUNTER — Ambulatory Visit (INDEPENDENT_AMBULATORY_CARE_PROVIDER_SITE_OTHER): Payer: Medicare Other | Admitting: Family Medicine

## 2019-03-26 ENCOUNTER — Other Ambulatory Visit: Payer: Self-pay

## 2019-03-26 ENCOUNTER — Encounter: Payer: Self-pay | Admitting: Family Medicine

## 2019-03-26 VITALS — BP 130/80 | HR 64 | Temp 98.1°F | Resp 18 | Ht 69.0 in | Wt 222.0 lb

## 2019-03-26 DIAGNOSIS — I10 Essential (primary) hypertension: Secondary | ICD-10-CM | POA: Diagnosis not present

## 2019-03-26 DIAGNOSIS — R002 Palpitations: Secondary | ICD-10-CM | POA: Diagnosis not present

## 2019-03-26 MED ORDER — HYDROCHLOROTHIAZIDE 25 MG PO TABS: 25.0000 mg | ORAL_TABLET | Freq: Every day | ORAL | 3 refills | Status: DC

## 2019-03-26 NOTE — Progress Notes (Signed)
Subjective:    Patient ID: Don Aguilar, male    DOB: 1950-11-01, 68 y.o.   MRN: [id number]  HPI  Patient has not been seen since 2017.  He presents today complaining of "tingling in both hands."  He also reports a "throbbing pulse in his neck" as well as "feeling his pulse in his eyes."  Past medical history is significant for coronary artery bypass grafting and coronary artery disease.  EKG is obtained today in office that shows normal sinus rhythm with diffuse T wave inversions throughout.  However this is a chronic finding and was present on EKGs obtained in 2018.  There is no significant change in comparison to his EKG from 2018.  When I asked the patient to describe it more, he states that for the last 2 weeks, whenever he lays down in bed at night, he can feel his heart pounding.  In fact his heart pound so "strongly" that he feels like his eyes will shake and his vision will shake slightly even when he tries to hold his head still.  The shaking occurs with his heartbeat.  He can feel the wave of pressure going up his neck as his heart beats.  He feels like his whole body vibrates when his heart beats.  My nurse got his blood pressure today to be normal however when I checked his blood pressure in his right arm I got 160/80.  In his left arm I got 160/76.  I am not sure if this is whitecoat syndrome or if the patient's blood pressure is running high.  He does not check his blood pressure regularly at home and therefore I have no other numbers to compare to however his description sounds like he is feeling the pulse pressure in his blood pressure   Past Medical History:  Diagnosis Date  . Coronary artery disease   . GERD (gastroesophageal reflux disease)   . Hyperlipidemia   . Hypertension 2010   Past Surgical History:  Procedure Laterality Date  . CARDIAC CATHETERIZATION  06/08/2009   No intervention - recommend CABG  . CARDIOVASCULAR STRESS TEST  05/30/2012   Small area of possible  ischemia in basal septal wall. ECG negative for ischemia, but frequent PVCs continued from mid to peak exercise with  Bigeminy pattern.  . CORONARY ARTERY BYPASS GRAFT  06/10/2009   x4. LIMA to LAD, SVG to first diag, SVG to circumflex, SVG to RCA. Endoscopic vein harvest from right thigh and right lower leg.  Marland Kitchen. HAND TENDON SURGERY  left hand, 1972  . TEE WITHOUT CARDIOVERSION  06/10/2009   EF 60-65%, Normal-trace findings  . VASCULAR EVALUATION  06/09/2009   No significant extracranial carotid artery stenosis demonstrated. Vertebrals are patent with antegrade flow.   Current Outpatient Medications on File Prior to Visit  Medication Sig Dispense Refill  . albuterol (PROVENTIL HFA;VENTOLIN HFA) 108 (90 Base) MCG/ACT inhaler Inhale 2 puffs into the lungs every 6 (six) hours as needed for wheezing or shortness of breath. 1 Inhaler 0  . aspirin 81 MG tablet Take 81 mg by mouth daily.      . finasteride (PROSCAR) 5 MG tablet Take 5 mg by mouth daily.    . metoprolol tartrate (LOPRESSOR) 50 MG tablet TAKE 1 & 1/2 (ONE & ONE-HALF) TABLETS BY MOUTH TWICE DAILY 90 tablet 3  . omeprazole (PRILOSEC) 20 MG capsule Take 1 capsule (20 mg total) by mouth 2 (two) times daily. 60 capsule 5  . ramipril (ALTACE) 10  MG capsule Take 1 capsule (10 mg total) by mouth daily. 90 capsule 1  . rosuvastatin (CRESTOR) 40 MG tablet Take 1 tablet (40 mg total) by mouth daily. 90 tablet 3   No current facility-administered medications on file prior to visit.    Allergies  Allergen Reactions  . Niaspan [Niacin Er] Itching    Lower legs   Social History   Socioeconomic History  . Marital status: Married    Spouse name: Not on file  . Number of children: Not on file  . Years of education: Not on file  . Highest education level: Not on file  Occupational History  . Not on file  Social Needs  . Financial resource strain: Not on file  . Food insecurity    Worry: Not on file    Inability: Not on file  .  Transportation needs    Medical: Not on file    Non-medical: Not on file  Tobacco Use  . Smoking status: Former Smoker    Packs/day: 2.00    Years: 13.00    Pack years: 26.00    Types: Cigarettes    Quit date: 03/15/1976    Years since quitting: 43.0  . Smokeless tobacco: Never Used  Substance and Sexual Activity  . Alcohol use: No    Alcohol/week: 0.0 standard drinks  . Drug use: No  . Sexual activity: Not on file  Lifestyle  . Physical activity    Days per week: Not on file    Minutes per session: Not on file  . Stress: Not on file  Relationships  . Social Herbalist on phone: Not on file    Gets together: Not on file    Attends religious service: Not on file    Active member of club or organization: Not on file    Attends meetings of clubs or organizations: Not on file    Relationship status: Not on file  . Intimate partner violence    Fear of current or ex partner: Not on file    Emotionally abused: Not on file    Physically abused: Not on file    Forced sexual activity: Not on file  Other Topics Concern  . Not on file  Social History Narrative  . Not on file     Review of Systems  All other systems reviewed and are negative.      Objective:   Physical Exam Vitals signs reviewed.  Constitutional:      General: He is not in acute distress.    Appearance: Normal appearance. He is not ill-appearing or toxic-appearing.  Cardiovascular:     Rate and Rhythm: Regular rhythm. Bradycardia present.     Pulses: Normal pulses.     Heart sounds: Normal heart sounds. No murmur. No friction rub. No gallop.   Pulmonary:     Effort: Pulmonary effort is normal. No respiratory distress.     Breath sounds: Normal breath sounds. No stridor. No wheezing or rhonchi.  Musculoskeletal:     Right lower leg: No edema.     Left lower leg: No edema.  Neurological:     Mental Status: He is alert.           Assessment & Plan:  The primary encounter diagnosis was  Palpitations. A diagnosis of Benign essential HTN was also pertinent to this visit. I believe the patient is feeling the difference between his systolic blood pressure and his diastolic blood pressure.  His systolic  blood pressure today is 160 mm.  His diastolic blood pressure is around 80 mm.  This represents a pulse pressure of 80 mmHg discrepancy.  I believe he is feeling this.  I have recommended lowering his blood pressure by adding hydrochlorothiazide 25 mg a day and then rechecking blood pressure in 1 week to see if his symptoms improve.  I would also like to obtain a CBC and a CMP as the patient has not had any lab work in 2 years to monitor his liver and kidney function.  He is not fasting and therefore I cannot obtain his cholesterol today.

## 2019-03-27 LAB — CBC WITH DIFFERENTIAL/PLATELET
Absolute Monocytes: 856 cells/uL (ref 200–950)
Basophils Absolute: 40 cells/uL (ref 0–200)
Basophils Relative: 0.5 %
Eosinophils Absolute: 112 cells/uL (ref 15–500)
Eosinophils Relative: 1.4 %
HCT: 43.2 % (ref 38.5–50.0)
Hemoglobin: 14.3 g/dL (ref 13.2–17.1)
Lymphs Abs: 2416 cells/uL (ref 850–3900)
MCH: 29.6 pg (ref 27.0–33.0)
MCHC: 33.1 g/dL (ref 32.0–36.0)
MCV: 89.4 fL (ref 80.0–100.0)
MPV: 13 fL — ABNORMAL HIGH (ref 7.5–12.5)
Monocytes Relative: 10.7 %
Neutro Abs: 4576 cells/uL (ref 1500–7800)
Neutrophils Relative %: 57.2 %
Platelets: 177 10*3/uL (ref 140–400)
RBC: 4.83 10*6/uL (ref 4.20–5.80)
RDW: 12.9 % (ref 11.0–15.0)
Total Lymphocyte: 30.2 %
WBC: 8 10*3/uL (ref 3.8–10.8)

## 2019-03-27 LAB — COMPLETE METABOLIC PANEL WITH GFR
AG Ratio: 1.4 (calc) (ref 1.0–2.5)
ALT: 21 U/L (ref 9–46)
AST: 23 U/L (ref 10–35)
Albumin: 3.9 g/dL (ref 3.6–5.1)
Alkaline phosphatase (APISO): 81 U/L (ref 35–144)
BUN: 16 mg/dL (ref 7–25)
CO2: 23 mmol/L (ref 20–32)
Calcium: 9.1 mg/dL (ref 8.6–10.3)
Chloride: 109 mmol/L (ref 98–110)
Creat: 1.17 mg/dL (ref 0.70–1.25)
GFR, Est African American: 74 mL/min/{1.73_m2} (ref >=60)
GFR, Est Non African American: 64 mL/min/{1.73_m2} (ref >=60)
Globulin: 2.7 g/dL (calc) (ref 1.9–3.7)
Glucose, Bld: 109 mg/dL — ABNORMAL HIGH (ref 65–99)
Potassium: 4.2 mmol/L (ref 3.5–5.3)
Sodium: 141 mmol/L (ref 135–146)
Total Bilirubin: 0.4 mg/dL (ref 0.2–1.2)
Total Protein: 6.6 g/dL (ref 6.1–8.1)

## 2019-04-02 ENCOUNTER — Ambulatory Visit: Payer: Medicare Other | Admitting: Family Medicine

## 2019-04-06 ENCOUNTER — Other Ambulatory Visit: Payer: Self-pay

## 2019-04-06 ENCOUNTER — Ambulatory Visit (INDEPENDENT_AMBULATORY_CARE_PROVIDER_SITE_OTHER): Payer: Medicare Other | Admitting: Family Medicine

## 2019-04-06 ENCOUNTER — Encounter: Payer: Self-pay | Admitting: Family Medicine

## 2019-04-06 VITALS — BP 100/68 | HR 66 | Temp 98.8°F | Resp 16 | Ht 69.0 in | Wt 220.0 lb

## 2019-04-06 DIAGNOSIS — N5201 Erectile dysfunction due to arterial insufficiency: Secondary | ICD-10-CM | POA: Diagnosis not present

## 2019-04-06 DIAGNOSIS — I10 Essential (primary) hypertension: Secondary | ICD-10-CM | POA: Diagnosis not present

## 2019-04-06 MED ORDER — SILDENAFIL CITRATE 100 MG PO TABS: 50.0000 mg | ORAL_TABLET | Freq: Every day | ORAL | 11 refills | Status: DC | PRN

## 2019-04-06 NOTE — Progress Notes (Signed)
Subjective:    Patient ID: Don Aguilar, male    DOB: [birth year], 68 y.o.   MRN: [id number]  HPI  Patient has not been seen since 2017.  He presents today complaining of "tingling in both hands."  He also reports a "throbbing pulse in his neck" as well as "feeling his pulse in his eyes."  Past medical history is significant for coronary artery bypass grafting and coronary artery disease.  EKG is obtained today in office that shows normal sinus rhythm with diffuse T wave inversions throughout.  However this is a chronic finding and was present on EKGs obtained in 2018.  There is no significant change in comparison to his EKG from 2018.  When I asked the patient to describe it more, he states that for the last 2 weeks, whenever he lays down in bed at night, he can feel his heart pounding.  In fact his heart pound so "strongly" that he feels like his eyes will shake and his vision will shake slightly even when he tries to hold his head still.  The shaking occurs with his heartbeat.  He can feel the wave of pressure going up his neck as his heart beats.  He feels like his whole body vibrates when his heart beats.  My nurse got his blood pressure today to be normal however when I checked his blood pressure in his right arm I got 160/80.  In his left arm I got 160/76.  I am not sure if this is whitecoat syndrome or if the patient's blood pressure is running high.  He does not check his blood pressure regularly at home and therefore I have no other numbers to compare to however his description sounds like he is feeling the pulse pressure in his blood pressure.  At that time, my plan was: I believe the patient is feeling the difference between his systolic blood pressure and his diastolic blood pressure.  His systolic blood pressure today is 160 mm.  His diastolic blood pressure is around 80 mm.  This represents a pulse pressure of 80 mmHg discrepancy.  I believe he is feeling this.  I have recommended  lowering his blood pressure by adding hydrochlorothiazide 25 mg a day and then rechecking blood pressure in 1 week to see if his symptoms improve.  I would also like to obtain a CBC and a CMP as the patient has not had any lab work in 2 years to monitor his liver and kidney function.  He is not fasting and therefore I cannot obtain his cholesterol today.  04/06/19 Today the patient's blood pressure is low at 100/80.  He states that since he has lowered his blood pressure, the pounding sensation in his neck has improved.  Also the ability to feel his pulse in his vision or in his body has improved.  He is not feeling as significant a pulse pressure as he was before although it is still present.  He denies any chest pain.  He denies any shortness of breath.  He denies any dyspnea on exertion.  He denies any headache or blurry vision.  However he does feel some dizziness.  I repeated his blood pressure today and found it to be 110/80   Past Medical History:  Diagnosis Date  . Coronary artery disease   . GERD (gastroesophageal reflux disease)   . Hyperlipidemia   . Hypertension 2010   Past Surgical History:  Procedure Laterality Date  . CARDIAC CATHETERIZATION  06/08/2009  No intervention - recommend CABG  . CARDIOVASCULAR STRESS TEST  05/30/2012   Small area of possible ischemia in basal septal wall. ECG negative for ischemia, but frequent PVCs continued from mid to peak exercise with  Bigeminy pattern.  . CORONARY ARTERY BYPASS GRAFT  06/10/2009   x4. LIMA to LAD, SVG to first diag, SVG to circumflex, SVG to RCA. Endoscopic vein harvest from right thigh and right lower leg.  Marland Kitchen. HAND TENDON SURGERY  left hand, 1972  . TEE WITHOUT CARDIOVERSION  06/10/2009   EF 60-65%, Normal-trace findings  . VASCULAR EVALUATION  06/09/2009   No significant extracranial carotid artery stenosis demonstrated. Vertebrals are patent with antegrade flow.   Current Outpatient Medications on File Prior to Visit   Medication Sig Dispense Refill  . albuterol (PROVENTIL HFA;VENTOLIN HFA) 108 (90 Base) MCG/ACT inhaler Inhale 2 puffs into the lungs every 6 (six) hours as needed for wheezing or shortness of breath. 1 Inhaler 0  . aspirin 81 MG tablet Take 81 mg by mouth daily.      . finasteride (PROSCAR) 5 MG tablet Take 5 mg by mouth daily.    . hydrochlorothiazide (HYDRODIURIL) 25 MG tablet Take 1 tablet (25 mg total) by mouth daily. 90 tablet 3  . metoprolol tartrate (LOPRESSOR) 50 MG tablet TAKE 1 & 1/2 (ONE & ONE-HALF) TABLETS BY MOUTH TWICE DAILY 90 tablet 3  . omeprazole (PRILOSEC) 20 MG capsule Take 1 capsule (20 mg total) by mouth 2 (two) times daily. 60 capsule 5  . ramipril (ALTACE) 10 MG capsule Take 1 capsule (10 mg total) by mouth daily. 90 capsule 1  . rosuvastatin (CRESTOR) 40 MG tablet Take 1 tablet (40 mg total) by mouth daily. 90 tablet 3   No current facility-administered medications on file prior to visit.    Allergies  Allergen Reactions  . Niaspan [Niacin Er] Itching    Lower legs   Social History   Socioeconomic History  . Marital status: Married    Spouse name: Not on file  . Number of children: Not on file  . Years of education: Not on file  . Highest education level: Not on file  Occupational History  . Not on file  Social Needs  . Financial resource strain: Not on file  . Food insecurity    Worry: Not on file    Inability: Not on file  . Transportation needs    Medical: Not on file    Non-medical: Not on file  Tobacco Use  . Smoking status: Former Smoker    Packs/day: 2.00    Years: 13.00    Pack years: 26.00    Types: Cigarettes    Quit date: 03/15/1976    Years since quitting: 43.0  . Smokeless tobacco: Never Used  Substance and Sexual Activity  . Alcohol use: No    Alcohol/week: 0.0 standard drinks  . Drug use: No  . Sexual activity: Not on file  Lifestyle  . Physical activity    Days per week: Not on file    Minutes per session: Not on file  .  Stress: Not on file  Relationships  . Social Musicianconnections    Talks on phone: Not on file    Gets together: Not on file    Attends religious service: Not on file    Active member of club or organization: Not on file    Attends meetings of clubs or organizations: Not on file    Relationship status: Not on file  .  Intimate partner violence    Fear of current or ex partner: Not on file    Emotionally abused: Not on file    Physically abused: Not on file    Forced sexual activity: Not on file  Other Topics Concern  . Not on file  Social History Narrative  . Not on file     Review of Systems  All other systems reviewed and are negative.      Objective:   Physical Exam Vitals signs reviewed.  Constitutional:      General: He is not in acute distress.    Appearance: Normal appearance. He is not ill-appearing or toxic-appearing.  Cardiovascular:     Rate and Rhythm: Normal rate and regular rhythm.     Pulses: Normal pulses.     Heart sounds: Normal heart sounds. No murmur. No friction rub. No gallop.   Pulmonary:     Effort: Pulmonary effort is normal. No respiratory distress.     Breath sounds: Normal breath sounds. No stridor. No wheezing or rhonchi.  Musculoskeletal:     Right lower leg: No edema.     Left lower leg: No edema.  Neurological:     Mental Status: He is alert.           Assessment & Plan:  The primary encounter diagnosis was Benign essential HTN. A diagnosis of Erectile dysfunction due to arterial insufficiency was also pertinent to this visit. I am concerned that we dropped his blood pressure too much.  Of asked him to break the hydrochlorothiazide in half and only take 12.5 mg daily.  Ideally would like his blood pressure to be around 120/80.  Today I believe is too low.  I do believe that since his pulse pressure has been reduced his symptoms have improved.  Therefore I feel no further work-up is necessary unless he develops warning signs symptoms such as  severe headache, pounding headache, chest pain, shortness of breath, dyspnea on exertion.  Patient states that he feels well today he.  He denies any of the symptoms.  He does report some erectile dysfunction.  He has never tried Viagra before.  He is not on nitroglycerin.  Therefore I recommended trying Viagra, 50 to 100 mg p.o. daily as needed for sexual activity.

## 2019-04-08 ENCOUNTER — Other Ambulatory Visit: Payer: Self-pay | Admitting: Cardiovascular Disease

## 2019-04-14 ENCOUNTER — Ambulatory Visit: Payer: Medicare Other

## 2019-04-14 ENCOUNTER — Other Ambulatory Visit: Payer: Self-pay

## 2019-04-14 VITALS — BP 98/64 | HR 66 | Temp 98.1°F | Resp 18 | Ht 69.0 in | Wt 220.0 lb

## 2019-04-14 DIAGNOSIS — I10 Essential (primary) hypertension: Secondary | ICD-10-CM

## 2019-04-14 NOTE — Progress Notes (Signed)
Pt came in for a bp reading. bp is 98/64 today. I advised pt to monitor and let us know the readings.

## 2019-05-07 ENCOUNTER — Other Ambulatory Visit: Payer: Self-pay | Admitting: Cardiovascular Disease

## 2019-05-29 ENCOUNTER — Other Ambulatory Visit: Payer: Self-pay

## 2019-06-01 ENCOUNTER — Encounter: Payer: Self-pay | Admitting: Family Medicine

## 2019-06-01 ENCOUNTER — Ambulatory Visit (INDEPENDENT_AMBULATORY_CARE_PROVIDER_SITE_OTHER): Payer: Medicare Other | Admitting: Family Medicine

## 2019-06-01 ENCOUNTER — Other Ambulatory Visit: Payer: Self-pay

## 2019-06-01 VITALS — BP 136/78 | HR 60 | Temp 97.8°F | Resp 16 | Ht 69.0 in | Wt 222.0 lb

## 2019-06-01 DIAGNOSIS — B028 Zoster with other complications: Secondary | ICD-10-CM

## 2019-06-01 MED ORDER — VALACYCLOVIR HCL 1 G PO TABS: 1000.0000 mg | ORAL_TABLET | Freq: Three times a day (TID) | ORAL | 0 refills | Status: DC

## 2019-06-01 MED ORDER — HYDROCODONE-ACETAMINOPHEN 5-325 MG PO TABS: 1.0000 | ORAL_TABLET | Freq: Four times a day (QID) | ORAL | 0 refills | Status: DC | PRN

## 2019-06-01 NOTE — Progress Notes (Signed)
Subjective:    Patient ID: Don Aguilar, male    DOB: 28-Jun-1951, 68 y.o.   MRN: [id number]  HPI  1 week ago, the patient developed a burning stinging pain in his right abdomen.  Less than a week ago he developed a vesicular rash in a dermatomal pattern.  There is a clump of about 10 yellow fluid-filled vesicles just above his umbilicus and then a linear grouping of vesicles radiating around his right abdomen in a dermatomal pattern towards his back.  Past Medical History:  Diagnosis Date  . Coronary artery disease   . GERD (gastroesophageal reflux disease)   . Hyperlipidemia   . Hypertension 2010   Past Surgical History:  Procedure Laterality Date  . CARDIAC CATHETERIZATION  06/08/2009   No intervention - recommend CABG  . CARDIOVASCULAR STRESS TEST  05/30/2012   Small area of possible ischemia in basal septal wall. ECG negative for ischemia, but frequent PVCs continued from mid to peak exercise with  Bigeminy pattern.  . CORONARY ARTERY BYPASS GRAFT  06/10/2009   x4. LIMA to LAD, SVG to first diag, SVG to circumflex, SVG to RCA. Endoscopic vein harvest from right thigh and right lower leg.  Marland Kitchen HAND TENDON SURGERY  left hand, 1972  . TEE WITHOUT CARDIOVERSION  06/10/2009   EF 60-65%, Normal-trace findings  . VASCULAR EVALUATION  06/09/2009   No significant extracranial carotid artery stenosis demonstrated. Vertebrals are patent with antegrade flow.   Current Outpatient Medications on File Prior to Visit  Medication Sig Dispense Refill  . albuterol (PROVENTIL HFA;VENTOLIN HFA) 108 (90 Base) MCG/ACT inhaler Inhale 2 puffs into the lungs every 6 (six) hours as needed for wheezing or shortness of breath. 1 Inhaler 0  . aspirin 81 MG tablet Take 81 mg by mouth daily.      . finasteride (PROSCAR) 5 MG tablet Take 5 mg by mouth daily.    . hydrochlorothiazide (HYDRODIURIL) 25 MG tablet Take 1 tablet (25 mg total) by mouth daily. 90 tablet 3  . metoprolol tartrate (LOPRESSOR) 50 MG  tablet TAKE 1 & 1/2 (ONE & ONE-HALF) TABLETS BY MOUTH TWICE DAILY 45 tablet 7  . omeprazole (PRILOSEC) 20 MG capsule Take 1 capsule (20 mg total) by mouth 2 (two) times daily. 60 capsule 5  . ramipril (ALTACE) 10 MG capsule Take 1 capsule (10 mg total) by mouth daily. 90 capsule 1  . rosuvastatin (CRESTOR) 40 MG tablet Take 1 tablet (40 mg total) by mouth daily. 90 tablet 3  . sildenafil (VIAGRA) 100 MG tablet Take 0.5-1 tablets (50-100 mg total) by mouth daily as needed for erectile dysfunction. 5 tablet 11   No current facility-administered medications on file prior to visit.    Allergies  Allergen Reactions  . Niaspan [Niacin Er] Itching    Lower legs   Social History   Socioeconomic History  . Marital status: Married    Spouse name: Not on file  . Number of children: Not on file  . Years of education: Not on file  . Highest education level: Not on file  Occupational History  . Not on file  Social Needs  . Financial resource strain: Not on file  . Food insecurity    Worry: Not on file    Inability: Not on file  . Transportation needs    Medical: Not on file    Non-medical: Not on file  Tobacco Use  . Smoking status: Former Smoker    Packs/day: 2.00  Years: 13.00    Pack years: 26.00    Types: Cigarettes    Quit date: 03/15/1976    Years since quitting: 43.2  . Smokeless tobacco: Never Used  Substance and Sexual Activity  . Alcohol use: No    Alcohol/week: 0.0 standard drinks  . Drug use: No  . Sexual activity: Not on file  Lifestyle  . Physical activity    Days per week: Not on file    Minutes per session: Not on file  . Stress: Not on file  Relationships  . Social Musician on phone: Not on file    Gets together: Not on file    Attends religious service: Not on file    Active member of club or organization: Not on file    Attends meetings of clubs or organizations: Not on file    Relationship status: Not on file  . Intimate partner violence     Fear of current or ex partner: Not on file    Emotionally abused: Not on file    Physically abused: Not on file    Forced sexual activity: Not on file  Other Topics Concern  . Not on file  Social History Narrative  . Not on file     Review of Systems  All other systems reviewed and are negative.      Objective:   Physical Exam Vitals signs reviewed.  Constitutional:      General: He is not in acute distress.    Appearance: Normal appearance. He is not ill-appearing or toxic-appearing.  Cardiovascular:     Rate and Rhythm: Regular rhythm. Bradycardia present.     Pulses: Normal pulses.     Heart sounds: Normal heart sounds. No murmur. No friction rub. No gallop.   Pulmonary:     Effort: Pulmonary effort is normal. No respiratory distress.     Breath sounds: Normal breath sounds. No stridor. No wheezing or rhonchi.  Abdominal:    Musculoskeletal:     Right lower leg: No edema.     Left lower leg: No edema.  Skin:    Findings: Rash present. Rash is vesicular.  Neurological:     Mental Status: He is alert.           Assessment & Plan:  The encounter diagnosis was Herpes zoster with complication. Begin Valtrex 1 g p.o. 3 times daily for 7 days.  Gave the patient Norco 5/325 1 p.o. every 6 hours as needed pain

## 2019-08-12 ENCOUNTER — Other Ambulatory Visit: Payer: Self-pay

## 2019-08-14 ENCOUNTER — Other Ambulatory Visit: Payer: Self-pay | Admitting: *Deleted

## 2019-08-14 MED ORDER — METOPROLOL TARTRATE 50 MG PO TABS: ORAL_TABLET | ORAL | 1 refills | Status: DC

## 2019-09-10 ENCOUNTER — Other Ambulatory Visit: Payer: Self-pay | Admitting: Cardiology

## 2019-09-10 DIAGNOSIS — Z20822 Contact with and (suspected) exposure to covid-19: Secondary | ICD-10-CM

## 2019-09-11 LAB — NOVEL CORONAVIRUS, NAA: SARS-CoV-2, NAA: NOT DETECTED

## 2019-11-02 ENCOUNTER — Other Ambulatory Visit: Payer: Self-pay | Admitting: Cardiovascular Disease

## 2019-11-18 ENCOUNTER — Other Ambulatory Visit: Payer: Self-pay | Admitting: Family Medicine

## 2019-11-20 ENCOUNTER — Other Ambulatory Visit: Payer: Self-pay | Admitting: Family Medicine

## 2020-01-09 ENCOUNTER — Other Ambulatory Visit: Payer: Self-pay | Admitting: Cardiovascular Disease

## 2020-01-22 ENCOUNTER — Ambulatory Visit: Payer: Medicare Other | Admitting: Family Medicine

## 2020-01-24 ENCOUNTER — Other Ambulatory Visit: Payer: Self-pay | Admitting: Family Medicine

## 2020-01-26 ENCOUNTER — Encounter: Payer: Self-pay | Admitting: Family Medicine

## 2020-01-26 ENCOUNTER — Other Ambulatory Visit: Payer: Self-pay

## 2020-01-26 ENCOUNTER — Ambulatory Visit (INDEPENDENT_AMBULATORY_CARE_PROVIDER_SITE_OTHER): Payer: Medicare Other | Admitting: Family Medicine

## 2020-01-26 VITALS — BP 118/70 | HR 68 | Temp 97.8°F | Resp 15 | Ht 69.0 in | Wt 225.0 lb

## 2020-01-26 DIAGNOSIS — H9313 Tinnitus, bilateral: Secondary | ICD-10-CM

## 2020-01-26 NOTE — Progress Notes (Signed)
Subjective:    Patient ID: Don Aguilar, male    DOB: 04-25-1951, 69 y.o.   MRN: [id number]  HPI  Patient presents today reporting a gradual onset of tinnitus over the last 3 years.  Left ear is worse than the right ear.  He denies any otalgia.  He denies any head injury.  He denies any headache.  He denies any vertigo.  He denies any sudden hearing loss although he has gradually been losing his hearing over the last several years.  His wife reports that he has diminished hearing.  Patient used to play in a band.  He also admits to using lots of power tools and machinery and being exposed to loud noises over time.  Past Medical History:  Diagnosis Date  . Coronary artery disease   . GERD (gastroesophageal reflux disease)   . Hyperlipidemia   . Hypertension 2010   Past Surgical History:  Procedure Laterality Date  . CARDIAC CATHETERIZATION  06/08/2009   No intervention - recommend CABG  . CARDIOVASCULAR STRESS TEST  05/30/2012   Small area of possible ischemia in basal septal wall. ECG negative for ischemia, but frequent PVCs continued from mid to peak exercise with  Bigeminy pattern.  . CORONARY ARTERY BYPASS GRAFT  06/10/2009   x4. LIMA to LAD, SVG to first diag, SVG to circumflex, SVG to RCA. Endoscopic vein harvest from right thigh and right lower leg.  Marland Kitchen HAND TENDON SURGERY  left hand, 1972  . TEE WITHOUT CARDIOVERSION  06/10/2009   EF 60-65%, Normal-trace findings  . VASCULAR EVALUATION  06/09/2009   No significant extracranial carotid artery stenosis demonstrated. Vertebrals are patent with antegrade flow.   Current Outpatient Medications on File Prior to Visit  Medication Sig Dispense Refill  . aspirin 81 MG tablet Take 81 mg by mouth daily.      . hydrochlorothiazide (HYDRODIURIL) 25 MG tablet Take 1 tablet (25 mg total) by mouth daily. 90 tablet 3  . metoprolol tartrate (LOPRESSOR) 50 MG tablet TAKE 1 & 1/2 (ONE & ONE-HALF) TABLETS BY MOUTH TWICE DAILY. Patient needs  office visit for further refills 1ST ATTEMPT. 90 tablet 0  . omeprazole (PRILOSEC) 20 MG capsule Take 1 capsule by mouth twice daily 60 capsule 5  . ramipril (ALTACE) 10 MG capsule Take 1 capsule by mouth once daily 90 capsule 0  . rosuvastatin (CRESTOR) 40 MG tablet Take 1 tablet (40 mg total) by mouth daily. 90 tablet 3  . sildenafil (VIAGRA) 100 MG tablet Take 0.5-1 tablets (50-100 mg total) by mouth daily as needed for erectile dysfunction. 5 tablet 11  . albuterol (PROVENTIL HFA;VENTOLIN HFA) 108 (90 Base) MCG/ACT inhaler Inhale 2 puffs into the lungs every 6 (six) hours as needed for wheezing or shortness of breath. (Patient not taking: Reported on 01/26/2020) 1 Inhaler 0  . finasteride (PROSCAR) 5 MG tablet Take 5 mg by mouth daily.     No current facility-administered medications on file prior to visit.   Allergies  Allergen Reactions  . Niaspan [Niacin Er] Itching    Lower legs   Social History   Socioeconomic History  . Marital status: Married    Spouse name: Not on file  . Number of children: Not on file  . Years of education: Not on file  . Highest education level: Not on file  Occupational History  . Not on file  Tobacco Use  . Smoking status: Former Smoker    Packs/day: 2.00    Years:  13.00    Pack years: 26.00    Types: Cigarettes    Quit date: 03/15/1976    Years since quitting: 43.8  . Smokeless tobacco: Never Used  Substance and Sexual Activity  . Alcohol use: No    Alcohol/week: 0.0 standard drinks  . Drug use: No  . Sexual activity: Not on file  Other Topics Concern  . Not on file  Social History Narrative  . Not on file   Social Determinants of Health   Financial Resource Strain:   . Difficulty of Paying Living Expenses:   Food Insecurity:   . Worried About Programme researcher, broadcasting/film/video in the Last Year:   . Barista in the Last Year:   Transportation Needs:   . Freight forwarder (Medical):   Marland Kitchen Lack of Transportation (Non-Medical):   Physical  Activity:   . Days of Exercise per Week:   . Minutes of Exercise per Session:   Stress:   . Feeling of Stress :   Social Connections:   . Frequency of Communication with Friends and Family:   . Frequency of Social Gatherings with Friends and Family:   . Attends Religious Services:   . Active Member of Clubs or Organizations:   . Attends Banker Meetings:   Marland Kitchen Marital Status:   Intimate Partner Violence:   . Fear of Current or Ex-Partner:   . Emotionally Abused:   Marland Kitchen Physically Abused:   . Sexually Abused:      Review of Systems  All other systems reviewed and are negative.      Objective:   Physical Exam Vitals reviewed.  Constitutional:      General: He is not in acute distress.    Appearance: Normal appearance. He is not ill-appearing or toxic-appearing.  HENT:     Right Ear: Tympanic membrane and ear canal normal. Decreased hearing noted. No middle ear effusion. There is no impacted cerumen. Tympanic membrane is not injected, scarred, perforated, erythematous, retracted or bulging.     Left Ear: Tympanic membrane and ear canal normal. Decreased hearing noted.  No middle ear effusion. There is no impacted cerumen. Tympanic membrane is not injected, scarred, perforated, erythematous, retracted or bulging.  Cardiovascular:     Rate and Rhythm: Normal rate and regular rhythm.     Pulses: Normal pulses.     Heart sounds: Normal heart sounds. No murmur. No friction rub. No gallop.   Pulmonary:     Effort: Pulmonary effort is normal. No respiratory distress.     Breath sounds: Normal breath sounds. No stridor. No wheezing or rhonchi.  Musculoskeletal:     Right lower leg: No edema.     Left lower leg: No edema.  Neurological:     Mental Status: He is alert.           Assessment & Plan:  The encounter diagnosis was Tinnitus aurium, bilateral. Discussed the causes of tinnitus including presbycusis which I believe is the most likely cause in this patient.   There is no visible abnormality on his exam and the gradual onset over several years makes me to believe this is the most likely case.  As result there are no effective long-term options other than to consider hearing aids and/or to use medicine such as Valium at night to help him sleep.  At the present time the patient is not interested in these options and is fine simply monitoring the situation clinically.  Offered the patient referral  to ENT but he declines at the present time.

## 2020-02-08 ENCOUNTER — Other Ambulatory Visit: Payer: Self-pay | Admitting: Cardiovascular Disease

## 2020-02-09 ENCOUNTER — Other Ambulatory Visit: Payer: Self-pay

## 2020-02-09 ENCOUNTER — Other Ambulatory Visit: Payer: Self-pay | Admitting: Family Medicine

## 2020-02-09 MED ORDER — HYDROCHLOROTHIAZIDE 25 MG PO TABS: 25.0000 mg | ORAL_TABLET | Freq: Every day | ORAL | 3 refills | Status: DC

## 2020-02-09 MED ORDER — ROSUVASTATIN CALCIUM 40 MG PO TABS: 40.0000 mg | ORAL_TABLET | Freq: Every day | ORAL | 0 refills | Status: DC

## 2020-02-09 MED ORDER — METOPROLOL TARTRATE 50 MG PO TABS: ORAL_TABLET | ORAL | 0 refills | Status: DC

## 2020-02-10 ENCOUNTER — Other Ambulatory Visit: Payer: Self-pay | Admitting: Cardiovascular Disease

## 2020-02-10 MED ORDER — METOPROLOL TARTRATE 50 MG PO TABS: 75.0000 mg | ORAL_TABLET | Freq: Two times a day (BID) | ORAL | 1 refills | Status: DC

## 2020-02-10 NOTE — Telephone Encounter (Signed)
*  STAT* If patient is at the pharmacy, call can be transferred to refill team.   1. Which medications need to be refilled? (please list name of each medication and dose if known) new prescription for his Metoprolol- pt is out of town  2. Which pharmacy/location (including street and city if local pharmacy) is medication to be sent to? [facility] [address]  [place] 3. Do they need a 30 day or 90 day supply? 90 days

## 2020-02-12 ENCOUNTER — Other Ambulatory Visit: Payer: Self-pay | Admitting: Cardiovascular Disease

## 2020-02-22 ENCOUNTER — Other Ambulatory Visit: Payer: Self-pay

## 2020-02-22 ENCOUNTER — Encounter: Payer: Medicare Other | Admitting: Nurse Practitioner

## 2020-02-22 ENCOUNTER — Telehealth (INDEPENDENT_AMBULATORY_CARE_PROVIDER_SITE_OTHER): Payer: Medicare Other | Admitting: Nurse Practitioner

## 2020-02-22 DIAGNOSIS — J209 Acute bronchitis, unspecified: Secondary | ICD-10-CM | POA: Diagnosis not present

## 2020-02-22 MED ORDER — BENZONATATE 100 MG PO CAPS: 100.0000 mg | ORAL_CAPSULE | Freq: Two times a day (BID) | ORAL | 0 refills | Status: DC | PRN

## 2020-02-22 MED ORDER — ALBUTEROL SULFATE HFA 108 (90 BASE) MCG/ACT IN AERS: 2.0000 | INHALATION_SPRAY | Freq: Four times a day (QID) | RESPIRATORY_TRACT | 1 refills | Status: DC | PRN

## 2020-02-22 MED ORDER — AZITHROMYCIN 250 MG PO TABS: ORAL_TABLET | ORAL | 0 refills | Status: DC

## 2020-02-22 MED ORDER — PREDNISONE 10 MG PO TABS: ORAL_TABLET | ORAL | 0 refills | Status: DC

## 2020-02-22 NOTE — Progress Notes (Signed)
Virtual Visit via Telephone Note  I connected with Don Aguilar on 02/22/20 at  2:30 PM EDT by telephone and verified that I am speaking with the correct person using two identifiers.   I discussed the limitations, risks, security and privacy concerns of performing an evaluation and management service by telephone and the availability of in person appointments. I also discussed with the patient that there may be a patient responsible charge related to this service. The patient expressed understanding and agreed to proceed.   History of Present Illness: Pt is a 69 year old male that presents for telephone visit for illness that started 6 days ago with a scratchy throat and cough. His cough has continued but worsened to become productive of white phlem sometimes yellow. He does feel that he has SOB with exertion and wheezing, his sxs worsen at night. He reports h/o similar sxs in the past. He reports h/o inhaler-albuterol and is out of needing a refill. He has used over the counter cough medication, alkaselzter sinus, cold and flu for treatment with minimal relif. No loss of taste, smell, h/a nasal congestion, drainage, sore throat. No others with similar sxs, Has completed Covid vaccine x2. No know COVID exposure. No fever/chills. Not in acute distress.     Past Medical History:  Diagnosis Date  . Coronary artery disease   . GERD (gastroesophageal reflux disease)   . Hyperlipidemia   . Hypertension 2010   Past Surgical History:  Procedure Laterality Date  . CARDIAC CATHETERIZATION  06/08/2009   No intervention - recommend CABG  . CARDIOVASCULAR STRESS TEST  05/30/2012   Small area of possible ischemia in basal septal wall. ECG negative for ischemia, but frequent PVCs continued from mid to peak exercise with  Bigeminy pattern.  . CORONARY ARTERY BYPASS GRAFT  06/10/2009   x4. LIMA to LAD, SVG to first diag, SVG to circumflex, SVG to RCA. Endoscopic vein harvest from right thigh and right  lower leg.  Marland Kitchen HAND TENDON SURGERY  left hand, 1972  . TEE WITHOUT CARDIOVERSION  06/10/2009   EF 60-65%, Normal-trace findings  . VASCULAR EVALUATION  06/09/2009   No significant extracranial carotid artery stenosis demonstrated. Vertebrals are patent with antegrade flow.   Current Outpatient Medications on File Prior to Visit  Medication Sig Dispense Refill  . aspirin 81 MG tablet Take 81 mg by mouth daily.      . finasteride (PROSCAR) 5 MG tablet Take 5 mg by mouth daily.    . hydrochlorothiazide (HYDRODIURIL) 25 MG tablet Take 1 tablet (25 mg total) by mouth daily. 90 tablet 3  . metoprolol tartrate (LOPRESSOR) 50 MG tablet Take 1.5 tablets (75 mg total) by mouth 2 (two) times daily. NEED OV. 60 tablet 0  . omeprazole (PRILOSEC) 20 MG capsule Take 1 capsule by mouth twice daily 60 capsule 5  . ramipril (ALTACE) 10 MG capsule Take 1 capsule by mouth once daily 90 capsule 0  . rosuvastatin (CRESTOR) 40 MG tablet Take 1 tablet (40 mg total) by mouth daily. 30 tablet 0  . sildenafil (VIAGRA) 100 MG tablet Take 0.5-1 tablets (50-100 mg total) by mouth daily as needed for erectile dysfunction. 5 tablet 11   No current facility-administered medications on file prior to visit.   Allergies  Allergen Reactions  . Niaspan [Niacin Er] Itching    Lower legs   Social History   Socioeconomic History  . Marital status: Married    Spouse name: Not on file  .  Number of children: Not on file  . Years of education: Not on file  . Highest education level: Not on file  Occupational History  . Not on file  Tobacco Use  . Smoking status: Former Smoker    Packs/day: 2.00    Years: 13.00    Pack years: 26.00    Types: Cigarettes    Quit date: 03/15/1976    Years since quitting: 43.9  . Smokeless tobacco: Never Used  Substance and Sexual Activity  . Alcohol use: No    Alcohol/week: 0.0 standard drinks  . Drug use: No  . Sexual activity: Not on file  Other Topics Concern  . Not on file   Social History Narrative  . Not on file   Social Determinants of Health   Financial Resource Strain:   . Difficulty of Paying Living Expenses:   Food Insecurity:   . Worried About Programme researcher, broadcasting/film/video in the Last Year:   . Barista in the Last Year:   Transportation Needs:   . Freight forwarder (Medical):   Marland Kitchen Lack of Transportation (Non-Medical):   Physical Activity:   . Days of Exercise per Week:   . Minutes of Exercise per Session:   Stress:   . Feeling of Stress :   Social Connections:   . Frequency of Communication with Friends and Family:   . Frequency of Social Gatherings with Friends and Family:   . Attends Religious Services:   . Active Member of Clubs or Organizations:   . Attends Banker Meetings:   Marland Kitchen Marital Status:   Intimate Partner Violence:   . Fear of Current or Ex-Partner:   . Emotionally Abused:   Marland Kitchen Physically Abused:   . Sexually Abused:    Breast Cancer-relatedfamily history is not on file. Immunization History  Administered Date(s) Administered  . Influenza, High Dose Seasonal PF 06/15/2017, 07/02/2018  . Tdap 03/05/2017     Observations/Objective: A/ox3, speaking full sentences, some coughing heard, did hear a wheeze once.   Assessment and Plan: Acute bronchitis, unspecified organism - Plan: benzonatate (TESSALON) 100 MG capsule, predniSONE (DELTASONE) 10 MG tablet, azithromycin (ZITHROMAX) 250 MG tablet, albuterol (VENTOLIN HFA) 108 (90 Base) MCG/ACT inhaler  Get plenty of rest, drink plenty of water   Follow Up Instructions:    I discussed the assessment and treatment plan with the patient. The patient was provided an opportunity to ask questions and all were answered. The patient agreed with the plan and demonstrated an understanding of the instructions.   The patient was advised to call back or seek an in-person evaluation if the symptoms worsen or if the condition fails to improve as anticipated.  I provided  15 minutes of non-face-to-face time during this encounter. Call ended 2:45 PM  Deneice Wack Gregary Signs, FNP

## 2020-02-22 NOTE — Progress Notes (Signed)
No video show and no answer on call

## 2020-02-25 ENCOUNTER — Other Ambulatory Visit: Payer: Self-pay | Admitting: Cardiovascular Disease

## 2020-02-26 ENCOUNTER — Ambulatory Visit (HOSPITAL_COMMUNITY)
Admission: RE | Admit: 2020-02-26 | Discharge: 2020-02-26 | Disposition: A | Discharge status: Home / Self Care | Payer: Medicare Other | Source: Ambulatory Visit | Attending: Family Medicine | Admitting: Family Medicine

## 2020-02-26 ENCOUNTER — Ambulatory Visit (HOSPITAL_COMMUNITY): Admission: RE | Admit: 2020-02-26 | Payer: Medicare Other | Source: Home / Self Care

## 2020-02-26 ENCOUNTER — Other Ambulatory Visit: Payer: Self-pay

## 2020-02-26 ENCOUNTER — Ambulatory Visit (INDEPENDENT_AMBULATORY_CARE_PROVIDER_SITE_OTHER): Payer: Medicare Other | Admitting: Family Medicine

## 2020-02-26 ENCOUNTER — Encounter: Payer: Self-pay | Admitting: Family Medicine

## 2020-02-26 VITALS — BP 116/80 | HR 62 | Temp 97.6°F | Ht 69.0 in | Wt 223.0 lb

## 2020-02-26 DIAGNOSIS — J209 Acute bronchitis, unspecified: Secondary | ICD-10-CM | POA: Diagnosis not present

## 2020-02-26 DIAGNOSIS — J9811 Atelectasis: Secondary | ICD-10-CM | POA: Diagnosis not present

## 2020-02-26 DIAGNOSIS — R05 Cough: Secondary | ICD-10-CM | POA: Diagnosis not present

## 2020-02-26 DIAGNOSIS — J90 Pleural effusion, not elsewhere classified: Secondary | ICD-10-CM | POA: Diagnosis not present

## 2020-02-26 MED ORDER — GUAIFENESIN-CODEINE 100-10 MG/5ML PO SOLN: ORAL | 0 refills | Status: DC

## 2020-02-26 MED ORDER — METHYLPREDNISOLONE ACETATE 40 MG/ML IJ SUSP
40.0000 mg | Freq: Once | INTRAMUSCULAR | Status: AC
  Administered 2020-02-26: 40 mg via INTRAMUSCULAR

## 2020-02-26 MED ORDER — IPRATROPIUM-ALBUTEROL 0.5-2.5 (3) MG/3ML IN SOLN
3.0000 mL | Freq: Once | RESPIRATORY_TRACT | Status: AC
  Administered 2020-02-26: 3 mL via RESPIRATORY_TRACT

## 2020-02-26 MED ORDER — PREDNISONE 10 MG PO TABS
ORAL_TABLET | ORAL | 0 refills | Status: DC
Start: 2020-02-26 — End: 2021-11-28

## 2020-02-26 NOTE — Progress Notes (Signed)
   Subjective:    Patient ID: Don Aguilar, male    DOB: 1951/05/10, 69 y.o.   MRN: [id number]  Patient presents for Cough (shortness of breath)   Pthere with continued cough and feeling SOB when he exerts himself.  He also hears himself wheezing.  His thought about a week ago he had some runny nose itchy throat.  This then progressed. He has had both COVID-19  He went to [facility] a few weeks ago,grandchild had a cold he thinks that he may have picked it up then.  He was seen Monday via telehealth. Stop azithromycin along with prednisone burst for 5 days and he was given albuterol.  He could see minimal improvement.  He was also given Tessalon but that did not help his cough.  He is not on oxygen therapy does not use CPAP he is not a smoker he quit 40 years ago.  He denies any chest pain or palpitations.  He has not had any fever or GI symptoms.   Review Of Systems:  GEN- denies fatigue, fever, weight loss,weakness, recent illness HEENT- denies eye drainage, change in vision, nasal discharge, CVS- denies chest pain, palpitations RESP-+SOB, +cough, +wheeze ABD- denies N/V, change in stools, abd pain GU- denies dysuria, hematuria, dribbling, incontinence MSK- denies joint pain, muscle aches, injury Neuro- denies headache, dizziness, syncope, seizure activity       Objective:    BP 116/80   Pulse 62   Temp 97.6 F (36.4 C) (Temporal)   Ht 5\' 9"  (1.753 m)   Wt 223 lb (101.2 kg)   SpO2 96%   BMI 32.93 kg/m  GEN- NAD, alert and oriented x3 HEENT- PERRL, EOMI, non injected sclera, pink conjunctiva, MMM, oropharynx clear,clear rhinorrhea in nose, no sinus tenderness  Neck- Supple, no LAD  CVS- RRR, no murmur RESP Bilat wheeze , bronchospasm, increased WOB with cough, no retractions at rest, speaking in full sentences , bilat rhonchi  EXT- No edema Pulses- Radial  2+  Status post DuoNeb improved bronchospasm and wheeze.  He still have rhonchi at the bases.     Assessment &  Plan:      Problem List Items Addressed This Visit    None    Visit Diagnoses    Acute bronchitis, unspecified organism    -  Primary   partially treated bronchitis, good response to duoneb in office, given Depo Medrol 40mg  IM, longer prednisone taper, wtih travel and persistant symptoms CXR to r/o PNA Robitussin codiene for cough in evening, mucinex DM during the day Use albuterol every 4 hours for the next day, then prn    Relevant Medications   ipratropium-albuterol (DUONEB) 0.5-2.5 (3) MG/3ML nebulizer solution 3 mL (Completed)   methylPREDNISolone acetate (DEPO-MEDROL) injection 40 mg (Completed) (Start on 02/26/2020  5:00 PM)   Other Relevant Orders   DG Chest 2 View      Note: This dictation was prepared with Dragon dictation along with smaller phrase technology. Any transcriptional errors that result from this process are unintentional.

## 2020-02-26 NOTE — Patient Instructions (Addendum)
Start the prednisone pills tomorrow, this will be a longer taper New cough medicine with codiene, you can use in the evening Take mucinex DM during the day  Use the albuterol 2 puffs every 4 hours for the next day  Get Chest xray at [facility]- go to main entrance We will call with lab results

## 2020-05-17 ENCOUNTER — Other Ambulatory Visit: Payer: Self-pay | Admitting: Family Medicine

## 2020-05-24 ENCOUNTER — Other Ambulatory Visit: Payer: Self-pay | Admitting: Cardiovascular Disease

## 2020-05-31 ENCOUNTER — Other Ambulatory Visit: Payer: Self-pay | Admitting: Cardiovascular Disease

## 2020-07-25 ENCOUNTER — Other Ambulatory Visit: Payer: Self-pay

## 2020-07-25 ENCOUNTER — Other Ambulatory Visit: Payer: Self-pay | Admitting: Cardiovascular Disease

## 2020-07-30 ENCOUNTER — Other Ambulatory Visit: Payer: Self-pay | Admitting: Cardiovascular Disease

## 2020-08-03 ENCOUNTER — Other Ambulatory Visit: Payer: Self-pay | Admitting: Cardiovascular Disease

## 2020-08-10 ENCOUNTER — Other Ambulatory Visit: Payer: Self-pay | Admitting: Family Medicine

## 2020-08-16 ENCOUNTER — Other Ambulatory Visit: Payer: Self-pay | Admitting: Cardiovascular Disease

## 2020-08-18 ENCOUNTER — Other Ambulatory Visit: Payer: Self-pay | Admitting: Cardiovascular Disease

## 2020-08-19 ENCOUNTER — Other Ambulatory Visit: Payer: Self-pay | Admitting: Family Medicine

## 2020-08-22 ENCOUNTER — Other Ambulatory Visit: Payer: Self-pay | Admitting: Family Medicine

## 2020-08-23 ENCOUNTER — Other Ambulatory Visit: Payer: Self-pay | Admitting: Cardiovascular Disease

## 2020-08-27 ENCOUNTER — Other Ambulatory Visit: Payer: Self-pay | Admitting: Cardiovascular Disease

## 2020-08-30 ENCOUNTER — Telehealth: Payer: Self-pay | Admitting: Cardiovascular Disease

## 2020-08-30 NOTE — Telephone Encounter (Signed)
*  STAT* If patient is at the pharmacy, call can be transferred to refill team.   1. Which medications need to be refilled? (please list name of each medication and dose if known) rosuvastatin (CRESTOR) 40 MG tablet  2. Which pharmacy/location (including street and city if local pharmacy) is medication to be sent to? [facility] - [place] ([place]), [place] - [address]  3. Do they need a 30 day or 90 day supply? 90 day supply

## 2020-08-31 MED ORDER — ROSUVASTATIN CALCIUM 40 MG PO TABS: 40.0000 mg | ORAL_TABLET | Freq: Every day | ORAL | 0 refills | Status: DC

## 2020-09-01 NOTE — Progress Notes (Addendum)
Virtual Visit via Telephone Note   This visit type was conducted due to national recommendations for restrictions regarding the COVID-19 Pandemic (e.g. social distancing) in an effort to limit this patient's exposure and mitigate transmission in our community.  Due to his co-morbid illnesses, this patient is at least at moderate risk for complications without adequate follow up.  This format is felt to be most appropriate for this patient at this time.  The patient did not have access to video technology/had technical difficulties with video requiring transitioning to audio format only (telephone).  All issues noted in this document were discussed and addressed.  No physical exam could be performed with this format.  Please refer to the patient's chart for his  consent to telehealth for [facility]    Date:  09/02/2020   ID:  Don Aguilar, DOB 1951-07-29, MRN [id number] The patient was identified using 2 identifiers.  Patient Location: Home Provider Location: Office/Clinic  PCP:  Susy Frizzle, MD  Cardiologist:  Shelva Majestic, MD  Electrophysiologist:  None   Evaluation Performed:  Follow-Up Visit  Chief Complaint:  palpitations  History of Present Illness:    Don Aguilar is a 70 y.o. male with CAD status post CABG (LIMA-LAD, SVG-D1, SVG-Cx, SVG-distal RCA, 05/2009), hypertension, OSA, obesity, and hyperlipidemia.  He is maintained on metoprolol tartrate twice daily for palpitations.  Echocardiogram in June 2018 with preserved EF 60 to 65%, moderate concentric hypertrophy, no significant valvular disease.  He had an improvement in his lipid profile when he increase Crestor from 20 mg to 40 mg.  He last saw Dr. Claiborne Billings on 11/28/2018 via telemedicine visit.  At that time he had not been using his CPAP.  He also reported dyspnea on exertion felt to be related to deconditioning.    He presents today for routine follow up. He has not started a walking program - wife states he is not  doing any activity. His only complaint is pins and needles in his feet. Wife reports that she hears him breathing at night. He does not have a CPAP machine. He reports completing a sleep study and was diagnosed with OSA, but has not received a CPAP machine because he "didn't pursue it." He denies chest pain and shortness of breath, but does report exertional dyspnea. He is not diagnosed with diabetes or neuropathy. Although his description sounds more like nerve compression rather than neuropathy.   He and his wife report palpitations and irregular heart beat. No dizziness or lightheadedness, no syncope. Palpitations are occurring daily. He states his palpitations are more noticeable at night, but occur anytime.   The patient does not have symptoms concerning for COVID-19 infection (fever, chills, cough, or new shortness of breath).    Past Medical History:  Diagnosis Date  . Coronary artery disease   . GERD (gastroesophageal reflux disease)   . Hyperlipidemia   . Hypertension 2010   Past Surgical History:  Procedure Laterality Date  . CARDIAC CATHETERIZATION  06/08/2009   No intervention - recommend CABG  . CARDIOVASCULAR STRESS TEST  05/30/2012   Small area of possible ischemia in basal septal wall. ECG negative for ischemia, but frequent PVCs continued from mid to peak exercise with  Bigeminy pattern.  . CORONARY ARTERY BYPASS GRAFT  06/10/2009   x4. LIMA to LAD, SVG to first diag, SVG to circumflex, SVG to RCA. Endoscopic vein harvest from right thigh and right lower leg.  Marland Kitchen HAND TENDON SURGERY  left hand, 1972  .  TEE WITHOUT CARDIOVERSION  06/10/2009   EF 60-65%, Normal-trace findings  . VASCULAR EVALUATION  06/09/2009   No significant extracranial carotid artery stenosis demonstrated. Vertebrals are patent with antegrade flow.     Current Meds  Medication Sig  . albuterol (VENTOLIN HFA) 108 (90 Base) MCG/ACT inhaler Inhale 2 puffs into the lungs every 6 (six) hours as needed for  wheezing or shortness of breath.  Marland Kitchen aspirin 81 MG tablet Take 81 mg by mouth daily.  . hydrochlorothiazide (HYDRODIURIL) 25 MG tablet Take 1 tablet by mouth once daily (Patient taking differently: 12.5 mg.)  . metoprolol tartrate (LOPRESSOR) 50 MG tablet Take 1.5 tablets (75 mg total) by mouth 2 (two) times daily.  Marland Kitchen omeprazole (PRILOSEC) 20 MG capsule Take 1 capsule by mouth twice daily  . ramipril (ALTACE) 10 MG capsule Take 1 capsule by mouth once daily  . rosuvastatin (CRESTOR) 40 MG tablet Take 1 tablet (40 mg total) by mouth daily.  . sildenafil (VIAGRA) 100 MG tablet Take 0.5-1 tablets (50-100 mg total) by mouth daily as needed for erectile dysfunction.     Allergies:   Niaspan [niacin er]   Social History   Tobacco Use  . Smoking status: Former Smoker    Packs/day: 2.00    Years: 13.00    Pack years: 26.00    Types: Cigarettes    Quit date: 03/15/1976    Years since quitting: 44.4  . Smokeless tobacco: Never Used  Substance Use Topics  . Alcohol use: No    Alcohol/week: 0.0 standard drinks  . Drug use: No     Family Hx: The patient's family history includes Cancer in his sister; Heart failure in his father and mother.  ROS:   Please see the history of present illness.     All other systems reviewed and are negative.   Prior CV studies:   The following studies were reviewed today:  ECHO Study Conclusions 01/31/2017  - Left ventricle: The cavity size was normal. There was moderate concentric hypertrophy. Systolic function was normal. The estimated ejection fraction was in the range of 60% to 65%. Wall motion was normal; there were no regional wall motion abnormalities. Left ventricular diastolic function parameters were normal. - Atrial septum: There was increased thickness of the septum, consistent with lipomatous hypertrophy.  Labs/Other Tests and Data Reviewed:    EKG:  No ECG reviewed.  Recent Labs: No results found for requested labs  within last 8760 hours.   Recent Lipid Panel Lab Results  Component Value Date/Time   CHOL 88 (L) 04/25/2017 08:32 AM   TRIG 101 04/25/2017 08:32 AM   HDL 31 (L) 04/25/2017 08:32 AM   CHOLHDL 2.8 04/25/2017 08:32 AM   CHOLHDL 6.3 (H) 01/30/2017 09:30 AM   LDLCALC 37 04/25/2017 08:32 AM    Wt Readings from Last 3 Encounters:  09/02/20 215 lb (97.5 kg)  02/26/20 223 lb (101.2 kg)  01/26/20 225 lb (102.1 kg)     Risk Assessment/Calculations:      Objective:    Vital Signs:  BP 120/80   Pulse (!) 52   Temp (!) 96.8 F (36 C)   Ht 5\' 9"  (1.753 m)   Wt 215 lb (97.5 kg)   BMI 31.75 kg/m    VITAL SIGNS:  reviewed GEN:  no acute distress RESPIRATORY:  respirations unlabored NEURO:  alert and oriented x 3, no obvious focal deficit PSYCH:  normal affect  ASSESSMENT & PLAN:    Palpitations - have been  moderately controlled with BB - given his obesity, sedentary lifestyle, and untreated OAS, I would like to rule out atrial arrhythmia - due to staffing, unable to obtain an EKG today - will try on Monday - will order 14-day zio patch to rule out Afib/flutter - continue BB    CAD status post CABG 2010 - Continue 81 mg aspirin, beta-blocker, ACE inhibitor. - he denies chest pain   Hypertension - pressure is well controlled - no medication changes    Hyperlipidemia - LDL improved to 37 on 40 mg of Crestor - Need updated lipid profile at next visit   OSA - Not on CPAP - will need a repeat sleep study - order placed   Obesity - Dr. Tresa Endo discussed exercise at his last visit - has not started a walking program yet - we discussed walking outside - he will try to start walking 5 min per day  Plan for EKG on Monday. If this shows Afib/flutter, will cancel monitor. If unremarkable EKG, will place 14 day zio patch with plans for follow up appt in 6 weeks.          COVID-19 Education: The signs and symptoms of COVID-19 were discussed with the patient and how to  seek care for testing (follow up with PCP or arrange E-visit).  The importance of social distancing was discussed today.  Time:   Today, I have spent 32 minutes with the patient with telehealth technology discussing the above problems.     Medication Adjustments/Labs and Tests Ordered: Current medicines are reviewed at length with the patient today.  Concerns regarding medicines are outlined above.   Tests Ordered: Orders Placed This Encounter  Procedures  . LONG TERM MONITOR (3-14 DAYS)    Medication Changes: No orders of the defined types were placed in this encounter.   Follow Up:  In Person in 6 week(s)  Signed, Marcelino Duster, [place]  09/02/2020 11:03 AM    [facility]

## 2020-09-02 ENCOUNTER — Encounter: Payer: Self-pay | Admitting: Radiology

## 2020-09-02 ENCOUNTER — Telehealth (INDEPENDENT_AMBULATORY_CARE_PROVIDER_SITE_OTHER): Payer: Medicare Other | Admitting: Physician Assistant

## 2020-09-02 ENCOUNTER — Ambulatory Visit (INDEPENDENT_AMBULATORY_CARE_PROVIDER_SITE_OTHER): Payer: Medicare Other

## 2020-09-02 ENCOUNTER — Encounter: Payer: Self-pay | Admitting: Physician Assistant

## 2020-09-02 VITALS — BP 120/80 | HR 52 | Temp 96.8°F | Ht 69.0 in | Wt 215.0 lb

## 2020-09-02 DIAGNOSIS — E669 Obesity, unspecified: Secondary | ICD-10-CM

## 2020-09-02 DIAGNOSIS — G4733 Obstructive sleep apnea (adult) (pediatric): Secondary | ICD-10-CM

## 2020-09-02 DIAGNOSIS — I10 Essential (primary) hypertension: Secondary | ICD-10-CM | POA: Diagnosis not present

## 2020-09-02 DIAGNOSIS — R002 Palpitations: Secondary | ICD-10-CM

## 2020-09-02 DIAGNOSIS — I2510 Atherosclerotic heart disease of native coronary artery without angina pectoris: Secondary | ICD-10-CM | POA: Diagnosis not present

## 2020-09-02 DIAGNOSIS — E785 Hyperlipidemia, unspecified: Secondary | ICD-10-CM | POA: Diagnosis not present

## 2020-09-02 NOTE — Patient Instructions (Addendum)
Medication Instructions:  The current medical regimen is effective;  continue present plan and medications.  *If you need a refill on your cardiac medications before your next appointment, please call your pharmacy*  Testing/Procedures: ZIO XT- Long Term Monitor Instructions   Your physician has requested you wear your ZIO patch monitor___14____days.   This is a single patch monitor.  [employer] supplies one patch monitor per enrollment.  Additional stickers are not available.   Please do not apply patch if you will be having a Nuclear Stress Test, Echocardiogram, Cardiac CT, MRI, or Chest Xray during the time frame you would be wearing the monitor. The patch cannot be worn during these tests.  You cannot remove and re-apply the ZIO XT patch monitor.   Your ZIO patch monitor will be sent USPS Priority mail from [facility] directly to your home address. The monitor may also be mailed to a PO BOX if home delivery is not available.   It may take 3-5 days to receive your monitor after you have been enrolled.   Once you have received you monitor, please review enclosed instructions.  Your monitor has already been registered assigning a specific monitor serial # to you.   Applying the monitor   Shave hair from upper left chest.   Hold abrader disc by orange tab.  Rub abrader in 40 strokes over left upper chest as indicated in your monitor instructions.   Clean area with 4 enclosed alcohol pads .  Use all pads to assure are is cleaned thoroughly.  Let dry.   Apply patch as indicated in monitor instructions.  Patch will be place under collarbone on left side of chest with arrow pointing upward.   Rub patch adhesive wings for 2 minutes.Remove white label marked "1".  Remove white label marked "2".  Rub patch adhesive wings for 2 additional minutes.   While looking in a mirror, press and release button in center of patch.  A small green light will flash 3-4 times .  This will be your only  indicator the monitor has been turned on.     Do not shower for the first 24 hours.  You may shower after the first 24 hours.   Press button if you feel a symptom. You will hear a small click.  Record Date, Time and Symptom in the Patient Log Book.   When you are ready to remove patch, follow instructions on last 2 pages of Patient Log Book.  Stick patch monitor onto last page of Patient Log Book.   Place Patient Log Book in [place] box.  Use locking tab on box and tape box closed securely.  The [employer] and [employer] has [employer] on it.  Please place in mailbox as soon as possible.  Your physician should have your test results approximately 7 days after the monitor has been mailed back to [facility].   Call [facility] Customer Care at [phone] if you have questions regarding your ZIO XT patch monitor.  Call them immediately if you see an orange light blinking on your monitor.   If your monitor falls off in less than 4 days contact our Monitor department at [phone].  If your monitor becomes loose or falls off after 4 days call [employer] at [phone] for suggestions on securing your monitor.     Follow-Up: At [facility], you and your health needs are our priority.  As part of our continuing mission to provide you with exceptional heart care, we have  created designated Provider Care Teams.  These Care Teams include your primary Cardiologist (physician) and Advanced Practice Providers (APPs -  Physician Assistants and Nurse Practitioners) who all work together to provide you with the care you need, when you need it.  We recommend signing up for the patient portal called "MyChart".  Sign up information is provided on this After Visit Summary.  MyChart is used to connect with patients for Virtual Visits (Telemedicine).  Patients are able to view lab/test results, encounter notes, upcoming appointments, etc.  Non-urgent messages can be sent to your provider as well.   To  learn more about what you can do with MyChart, go to [email or web address].    Your next appointment:   6 week(s)  The format for your next appointment:   In Person  Provider:   You may see Nicki Guadalajara, MD or one of the following Advanced Practice Providers on your designated Care Team:    Azalee Course, PA-C  Micah Flesher, [place] or   Judy Pimple, [place]    Other Instructions Please come for EKG Monday, January 10th 2022 at 10 AM.

## 2020-09-02 NOTE — Progress Notes (Signed)
Enrolled patient for a 14 day Zio XT Monitor to be mailed to patients home  

## 2020-09-05 ENCOUNTER — Other Ambulatory Visit: Payer: Self-pay

## 2020-09-05 ENCOUNTER — Ambulatory Visit (INDEPENDENT_AMBULATORY_CARE_PROVIDER_SITE_OTHER): Payer: Medicare Other | Admitting: *Deleted

## 2020-09-05 DIAGNOSIS — R002 Palpitations: Secondary | ICD-10-CM

## 2020-09-05 NOTE — Progress Notes (Signed)
1.) Reason for visit: EKG  2.) Name of MD requesting visit: angela [facility]  3.) H&P: palpitations  4.) ROS related to problem: patient complained of palpitations on recent virtual visit with app. EKG done to r/o atrial fib/flutter.   5.) Assessment and plan per MD: EKG completed and reviewed by Dr Bjorn Pippin and shows sinus rhythm with PVC. Per office note the patient will get a zio monitor.

## 2020-09-06 DIAGNOSIS — R002 Palpitations: Secondary | ICD-10-CM | POA: Diagnosis not present

## 2020-09-09 ENCOUNTER — Telehealth: Payer: Self-pay | Admitting: *Deleted

## 2020-09-09 ENCOUNTER — Other Ambulatory Visit: Payer: Self-pay | Admitting: Cardiovascular Disease

## 2020-09-09 DIAGNOSIS — I10 Essential (primary) hypertension: Secondary | ICD-10-CM

## 2020-09-09 DIAGNOSIS — G4733 Obstructive sleep apnea (adult) (pediatric): Secondary | ICD-10-CM

## 2020-09-09 DIAGNOSIS — E669 Obesity, unspecified: Secondary | ICD-10-CM

## 2020-09-09 NOTE — Telephone Encounter (Signed)
Left message he has to have another sleep study. 10/24/20 appointment details left on patient's cell voicemail. Per [facility] web portal no PA is required. Decision ID: [id number].

## 2020-09-27 ENCOUNTER — Other Ambulatory Visit: Payer: Self-pay | Admitting: Cardiovascular Disease

## 2020-09-27 DIAGNOSIS — R002 Palpitations: Secondary | ICD-10-CM | POA: Diagnosis not present

## 2020-09-28 ENCOUNTER — Other Ambulatory Visit: Payer: Self-pay | Admitting: Cardiovascular Disease

## 2020-09-29 ENCOUNTER — Other Ambulatory Visit: Payer: Self-pay | Admitting: Cardiovascular Disease

## 2020-09-29 ENCOUNTER — Other Ambulatory Visit: Payer: Self-pay

## 2020-09-29 MED ORDER — ROSUVASTATIN CALCIUM 40 MG PO TABS: 40.0000 mg | ORAL_TABLET | Freq: Every day | ORAL | 2 refills | Status: DC

## 2020-10-08 DIAGNOSIS — Z20822 Contact with and (suspected) exposure to covid-19: Secondary | ICD-10-CM | POA: Diagnosis not present

## 2020-10-08 DIAGNOSIS — Z03818 Encounter for observation for suspected exposure to other biological agents ruled out: Secondary | ICD-10-CM | POA: Diagnosis not present

## 2020-10-24 ENCOUNTER — Ambulatory Visit (HOSPITAL_BASED_OUTPATIENT_CLINIC_OR_DEPARTMENT_OTHER): Payer: Medicare Other | Attending: Cardiovascular Disease | Admitting: Cardiovascular Disease

## 2020-10-24 ENCOUNTER — Other Ambulatory Visit: Payer: Self-pay

## 2020-10-24 DIAGNOSIS — Z7982 Long term (current) use of aspirin: Secondary | ICD-10-CM | POA: Insufficient documentation

## 2020-10-24 DIAGNOSIS — Z7952 Long term (current) use of systemic steroids: Secondary | ICD-10-CM | POA: Diagnosis not present

## 2020-10-24 DIAGNOSIS — Z6831 Body mass index (BMI) 31.0-31.9, adult: Secondary | ICD-10-CM | POA: Insufficient documentation

## 2020-10-24 DIAGNOSIS — E669 Obesity, unspecified: Secondary | ICD-10-CM | POA: Diagnosis not present

## 2020-10-24 DIAGNOSIS — Z79899 Other long term (current) drug therapy: Secondary | ICD-10-CM | POA: Insufficient documentation

## 2020-10-24 DIAGNOSIS — G4733 Obstructive sleep apnea (adult) (pediatric): Secondary | ICD-10-CM

## 2020-10-24 DIAGNOSIS — G4736 Sleep related hypoventilation in conditions classified elsewhere: Secondary | ICD-10-CM | POA: Diagnosis not present

## 2020-10-24 DIAGNOSIS — I10 Essential (primary) hypertension: Secondary | ICD-10-CM | POA: Diagnosis not present

## 2020-10-24 DIAGNOSIS — G4730 Sleep apnea, unspecified: Secondary | ICD-10-CM | POA: Diagnosis not present

## 2020-10-26 ENCOUNTER — Other Ambulatory Visit: Payer: Self-pay | Admitting: Cardiovascular Disease

## 2020-11-06 ENCOUNTER — Encounter (HOSPITAL_BASED_OUTPATIENT_CLINIC_OR_DEPARTMENT_OTHER): Payer: Self-pay | Admitting: Cardiovascular Disease

## 2020-11-06 NOTE — Procedures (Signed)
Patient Name: Don Aguilar, Don Aguilar Date: 10/24/2020 Gender: Male D.O.B: [birth year] Age (years): 57 Referring Provider: Nicki Guadalajara MD, ABSM Height (inches): 70 Interpreting Physician: Nicki Guadalajara MD, ABSM Weight (lbs): 219 RPSGT: Rosette Reveal BMI: 31 MRN: [id number] Neck Size: 18.00  CLINICAL INFORMATION Sleep Study Type: NPSG  Indication for sleep study: Hypertension, OSA with previous study ~ 2013;  AHI 8.5/h; RDI 25.4, never pursued CPAP.  Epworth Sleepiness Score: 9  SLEEP STUDY TECHNIQUE As per the AASM Manual for the Scoring of Sleep and Associated Events v2.3 (April 2016) with a hypopnea requiring 4% desaturations.  The channels recorded and monitored were frontal, central and occipital EEG, electrooculogram (EOG), submentalis EMG (chin), nasal and oral airflow, thoracic and abdominal wall motion, anterior tibialis EMG, snore microphone, electrocardiogram, and pulse oximetry.  MEDICATIONS albuterol (VENTOLIN HFA) 108 (90 Base) MCG/ACT inhaler aspirin 81 MG tablet benzonatate (TESSALON) 100 MG capsule finasteride (PROSCAR) 5 MG tablet guaiFENesin-codeine 100-10 MG/5ML syrup hydrochlorothiazide (HYDRODIURIL) 25 MG tablet metoprolol tartrate (LOPRESSOR) 50 MG tablet omeprazole (PRILOSEC) 20 MG capsule predniSONE (DELTASONE) 10 MG tablet ramipril (ALTACE) 10 MG capsule rosuvastatin (CRESTOR) 40 MG tablet sildenafil (VIAGRA) 100 MG tablet Medications self-administered by patient taken the night of the study : N/A  SLEEP ARCHITECTURE The study was initiated at 10:43:39 PM and ended at 4:49:36 AM.  Sleep onset time was 28.8 minutes and the sleep efficiency was 50.7%%. The total sleep time was 185.5 minutes.  Stage REM latency was 52.5 minutes.  The patient spent 8.9%% of the night in stage N1 sleep, 77.4%% in stage N2 sleep, 0.0%% in stage N3 and 13.8% in REM.  Alpha intrusion was absent.  Supine sleep was 39.62%.  RESPIRATORY PARAMETERS The  overall apnea/hypopnea index (AHI) was 4.9 per hour. The respiratory disturbance index (RDI) was 9.1/hr. There were 1 total apneas, including 0 obstructive, 1 central and 0 mixed apneas. There were 14 hypopneas and 13 RERAs.  The AHI during Stage REM sleep was 9.4 per hour.  AHI while supine was 4.9 per hour.  The mean oxygen saturation was 94.7%. The minimum SpO2 during sleep was 90.0%.  Moderate snoring was noted during this study.  CARDIAC DATA The 2 lead EKG demonstrated sinus rhythm. The mean heart rate was 64.4 beats per minute. Other EKG findings include: PVCs.  LEG MOVEMENT DATA The total PLMS were 0 with a resulting PLMS index of 0.0. Associated arousal with leg movement index was 0.0 .  IMPRESSIONS - Increased upper airway resisitance with borderline sleep apnea overall (AHI 4.9/h; RDI 9.1/h) and mild sleep apnea duirng REM sleep (AHI 9.4/h). - No significant central sleep apnea occurred during this study (CAI = 0.3/h). - No significant oxygen desaturation to a nadir of 90%i - Abnormal sleep archecture with absent slow wave and reduction in REM sleep - EKG findings include PVCs. - Clinically significant periodic limb movements did not occur during sleep. No significant associated arousals.  DIAGNOSIS - Sleep apnea, unspecified (G47.30) - Nocturnal Hypoxemia (G47.36)  RECOMMENDATIONS - At pressent patient does not meet creitieria for CPAP; however consider alternatives for borderline sleep apnea overall with mild sleep apnea during REM sleep. - Effort should be made to optimize nasal and oropharyngeal patency. - Consider evaluation for an oral appliance. - If symprtoms progress , consider a future follow-up evaluation. - Avoid alcohol, sedatives and other CNS depressants that may worsen sleep apnea and disrupt normal sleep architecture. - Sleep hygiene should be reviewed to assess factors that may improve sleep  quality. - Weight management (BMI 31) and regular exercise  should be initiated or continued if appropriate.  [Electronically signed] 11/06/2020 11:56 AM  Nicki Guadalajara MD, [facility], ABSM Diplomate, American Board of Sleep Medicine   NPI: [id number] [facility] PH: [phone]   FX: [id number] ACCREDITED BY THE AMERICAN ACADEMY OF SLEEP MEDICINE

## 2020-11-07 ENCOUNTER — Telehealth: Payer: Self-pay | Admitting: *Deleted

## 2020-11-07 NOTE — Telephone Encounter (Signed)
Left message to return a call to discuss sleep study results and recommendations. 

## 2020-11-10 NOTE — Telephone Encounter (Signed)
Patients wife returned a call to discuss sleep study results. She will consult with the patient to see if he wants to try using a oral appliance. If so they will call me back to do the referral.

## 2020-12-17 ENCOUNTER — Other Ambulatory Visit: Payer: Self-pay | Admitting: Cardiovascular Disease

## 2021-01-24 ENCOUNTER — Other Ambulatory Visit: Payer: Self-pay | Admitting: Cardiovascular Disease

## 2021-02-10 ENCOUNTER — Other Ambulatory Visit: Payer: Self-pay | Admitting: Family Medicine

## 2021-02-21 ENCOUNTER — Other Ambulatory Visit: Payer: Self-pay | Admitting: Family Medicine

## 2021-02-28 ENCOUNTER — Other Ambulatory Visit: Payer: Self-pay | Admitting: Family Medicine

## 2021-04-10 ENCOUNTER — Other Ambulatory Visit: Payer: Self-pay | Admitting: Family Medicine

## 2021-05-12 ENCOUNTER — Other Ambulatory Visit: Payer: Self-pay | Admitting: Family Medicine

## 2021-05-19 ENCOUNTER — Other Ambulatory Visit: Payer: Self-pay | Admitting: Family Medicine

## 2021-05-25 ENCOUNTER — Other Ambulatory Visit: Payer: Self-pay | Admitting: Family Medicine

## 2021-06-20 ENCOUNTER — Other Ambulatory Visit: Payer: Self-pay | Admitting: Family Medicine

## 2021-08-09 DIAGNOSIS — Z79899 Other long term (current) drug therapy: Secondary | ICD-10-CM | POA: Diagnosis not present

## 2021-08-09 DIAGNOSIS — Z86010 Personal history of colonic polyps: Secondary | ICD-10-CM | POA: Diagnosis not present

## 2021-08-11 ENCOUNTER — Other Ambulatory Visit: Payer: Self-pay | Admitting: Family Medicine

## 2021-08-11 ENCOUNTER — Other Ambulatory Visit: Payer: Self-pay | Admitting: Cardiovascular Disease

## 2021-08-14 ENCOUNTER — Other Ambulatory Visit: Payer: Self-pay | Admitting: Family Medicine

## 2021-08-16 ENCOUNTER — Other Ambulatory Visit: Payer: Self-pay | Admitting: Family Medicine

## 2021-08-18 ENCOUNTER — Other Ambulatory Visit: Payer: Self-pay | Admitting: Family Medicine

## 2021-09-04 DIAGNOSIS — K635 Polyp of colon: Secondary | ICD-10-CM | POA: Diagnosis not present

## 2021-09-04 DIAGNOSIS — Z86010 Personal history of colonic polyps: Secondary | ICD-10-CM | POA: Diagnosis not present

## 2021-09-04 DIAGNOSIS — Z1211 Encounter for screening for malignant neoplasm of colon: Secondary | ICD-10-CM | POA: Diagnosis not present

## 2021-09-26 ENCOUNTER — Other Ambulatory Visit: Payer: Self-pay | Admitting: Family Medicine

## 2021-10-05 DIAGNOSIS — Z86010 Personal history of colonic polyps: Secondary | ICD-10-CM | POA: Diagnosis not present

## 2021-10-05 DIAGNOSIS — K648 Other hemorrhoids: Secondary | ICD-10-CM | POA: Diagnosis not present

## 2021-11-09 ENCOUNTER — Other Ambulatory Visit: Payer: Self-pay | Admitting: Family Medicine

## 2021-11-09 ENCOUNTER — Other Ambulatory Visit: Payer: Self-pay | Admitting: Cardiovascular Disease

## 2021-11-17 ENCOUNTER — Other Ambulatory Visit: Payer: Self-pay | Admitting: Family Medicine

## 2021-11-28 ENCOUNTER — Other Ambulatory Visit: Payer: Self-pay | Admitting: Family Medicine

## 2021-11-28 ENCOUNTER — Ambulatory Visit (INDEPENDENT_AMBULATORY_CARE_PROVIDER_SITE_OTHER): Payer: Medicare Other | Admitting: Family Medicine

## 2021-11-28 VITALS — BP 118/68 | HR 68 | Temp 97.1°F | Ht 70.0 in | Wt 222.6 lb

## 2021-11-28 DIAGNOSIS — Z125 Encounter for screening for malignant neoplasm of prostate: Secondary | ICD-10-CM

## 2021-11-28 DIAGNOSIS — R739 Hyperglycemia, unspecified: Secondary | ICD-10-CM

## 2021-11-28 DIAGNOSIS — I10 Essential (primary) hypertension: Secondary | ICD-10-CM

## 2021-11-28 DIAGNOSIS — E785 Hyperlipidemia, unspecified: Secondary | ICD-10-CM | POA: Diagnosis not present

## 2021-11-28 NOTE — Progress Notes (Signed)
? ?Subjective:  ? ? Patient ID: Don Aguilar, male    DOB: 06/27/1951, 71 y.o.   MRN: [id number] ? ?HPI ?Patient has not been seen in quite some time.  He has a history of hyperlipidemia, coronary artery disease, hypertension.  He had a colonoscopy earlier this year however he is due for Prevnar 20 and he is also due for prostate cancer screening.  He denies any chest pain shortness of breath or dyspnea on exertion.  He denies any myalgias or right upper quadrant pain.  He does report some neuropathy in his feet which is particularly worse when he sitting but improves when he stands.  He also reports some pins-and-needles in his fingertips.  Other than that he is doing well with no concerns. ?Past Medical History:  ?Diagnosis Date  ? Coronary artery disease   ? GERD (gastroesophageal reflux disease)   ? Hyperlipidemia   ? Hypertension 2010  ? ?Past Surgical History:  ?Procedure Laterality Date  ? CARDIAC CATHETERIZATION  06/08/2009  ? No intervention - recommend CABG  ? CARDIOVASCULAR STRESS TEST  05/30/2012  ? Small area of possible ischemia in basal septal wall. ECG negative for ischemia, but frequent PVCs continued from mid to peak exercise with  Bigeminy pattern.  ? CORONARY ARTERY BYPASS GRAFT  06/10/2009  ? x4. LIMA to LAD, SVG to first diag, SVG to circumflex, SVG to RCA. Endoscopic vein harvest from right thigh and right lower leg.  ? HAND TENDON SURGERY  left hand, 1972  ? TEE WITHOUT CARDIOVERSION  06/10/2009  ? EF 60-65%, Normal-trace findings  ? VASCULAR EVALUATION  06/09/2009  ? No significant extracranial carotid artery stenosis demonstrated. Vertebrals are patent with antegrade flow.  ? ?Current Outpatient Medications on File Prior to Visit  ?Medication Sig Dispense Refill  ? aspirin 81 MG tablet Take 81 mg by mouth daily.    ? hydrochlorothiazide (HYDRODIURIL) 25 MG tablet Take 1 tablet (25 mg total) by mouth daily. PT NEEDS OV FOR FURTHER REFILLS 30 tablet 0  ? metoprolol tartrate (LOPRESSOR) 50 MG  tablet TAKE 1 & 1/2 (ONE & ONE-HALF) TABLETS BY MOUTH TWICE DAILY 270 tablet 0  ? omeprazole (PRILOSEC) 20 MG capsule Take 1 capsule by mouth twice daily 60 capsule 0  ? ramipril (ALTACE) 10 MG capsule Take 1 capsule (10 mg total) by mouth daily. PT NEEDS OV FOR FURTHER REFILLS 30 capsule 0  ? rosuvastatin (CRESTOR) 40 MG tablet Take 1 tablet by mouth once daily 90 tablet 0  ? sildenafil (VIAGRA) 100 MG tablet Take 0.5-1 tablets (50-100 mg total) by mouth daily as needed for erectile dysfunction. 5 tablet 11  ? albuterol (VENTOLIN HFA) 108 (90 Base) MCG/ACT inhaler Inhale 2 puffs into the lungs every 6 (six) hours as needed for wheezing or shortness of breath. (Patient not taking: Reported on 11/28/2021) 18 g 1  ? benzonatate (TESSALON) 100 MG capsule Take 1 capsule (100 mg total) by mouth 2 (two) times daily as needed for cough. (Patient not taking: Reported on 11/28/2021) 20 capsule 0  ? finasteride (PROSCAR) 5 MG tablet Take 5 mg by mouth daily. (Patient not taking: Reported on 11/28/2021)    ? guaiFENesin-codeine 100-10 MG/5ML syrup 5-10 ml po every 6 hours prn cough (Patient not taking: Reported on 11/28/2021) 180 mL 0  ? ?No current facility-administered medications on file prior to visit.  ? ?Allergies  ?Allergen Reactions  ? Niaspan [Niacin Er] Itching  ?  Lower legs  ? ?Social History  ? ?  Socioeconomic History  ? Marital status: Married  ?  Spouse name: Not on file  ? Number of children: Not on file  ? Years of education: Not on file  ? Highest education level: Not on file  ?Occupational History  ? Not on file  ?Tobacco Use  ? Smoking status: Former  ?  Packs/day: 2.00  ?  Years: 13.00  ?  Pack years: 26.00  ?  Types: Cigarettes  ?  Quit date: 03/15/1976  ?  Years since quitting: 45.7  ? Smokeless tobacco: Never  ?Substance and Sexual Activity  ? Alcohol use: No  ?  Alcohol/week: 0.0 standard drinks  ? Drug use: No  ? Sexual activity: Not on file  ?Other Topics Concern  ? Not on file  ?Social History Narrative  ?  Not on file  ? ?Social Determinants of Health  ? ?Financial Resource Strain: Not on file  ?Food Insecurity: Not on file  ?Transportation Needs: Not on file  ?Physical Activity: Not on file  ?Stress: Not on file  ?Social Connections: Not on file  ?Intimate Partner Violence: Not on file  ? ? ? ? ?Review of Systems  ?All other systems reviewed and are negative. ? ?   ?Objective:  ? Physical Exam ?Vitals reviewed.  ?Constitutional:   ?   Appearance: Normal appearance.  ?Cardiovascular:  ?   Rate and Rhythm: Normal rate and regular rhythm.  ?   Pulses: Normal pulses.  ?   Heart sounds: Normal heart sounds. No murmur heard. ?  No friction rub. No gallop.  ?Pulmonary:  ?   Effort: Pulmonary effort is normal. No respiratory distress.  ?   Breath sounds: Normal breath sounds. No stridor.  ?Abdominal:  ?   General: Bowel sounds are normal. There is no distension.  ?   Palpations: Abdomen is soft.  ?   Tenderness: There is no abdominal tenderness. There is no guarding or rebound.  ?Musculoskeletal:  ?   Right lower leg: No edema.  ?   Left lower leg: No edema.  ?Neurological:  ?   Mental Status: He is alert.  ? ? ? ? ? ?   ?Assessment & Plan:  ?Hyperlipidemia with target LDL less than 70 - Plan: CBC with Differential/Platelet, Lipid panel, COMPLETE METABOLIC PANEL WITH GFR ? ?Primary hypertension - Plan: CBC with Differential/Platelet, Lipid panel, COMPLETE METABOLIC PANEL WITH GFR ? ?Prostate cancer screening - Plan: PSA ?I will screen for prostate cancer by checking a PSA.  Patient's blood pressure today is outstanding at 118/68.  He is not fasting but he has not had lab work in over 3 years and he often forgets to return for lab work.  Therefore I will check a CBC CMP and a lipid panel along have the patient here today just to get some baseline labs.  Patient received Prevnar 20 while he was here today.  I recommended Shingrix. ? ?

## 2021-11-30 ENCOUNTER — Telehealth: Payer: Self-pay

## 2021-11-30 ENCOUNTER — Other Ambulatory Visit: Payer: Self-pay

## 2021-11-30 DIAGNOSIS — R739 Hyperglycemia, unspecified: Secondary | ICD-10-CM

## 2021-11-30 MED ORDER — OMEPRAZOLE 20 MG PO CPDR
20.0000 mg | DELAYED_RELEASE_CAPSULE | Freq: Two times a day (BID) | ORAL | 1 refills | Status: DC
Start: 2021-11-30 — End: 2022-07-30

## 2021-11-30 NOTE — Addendum Note (Signed)
Addended by: Shelby Dubin on: 11/30/2021 03:23 PM ? ? Modules accepted: Orders ? ?

## 2021-12-02 LAB — COMPLETE METABOLIC PANEL WITHOUT GFR
AG Ratio: 1.6 (calc) (ref 1.0–2.5)
ALT: 21 U/L (ref 9–46)
AST: 27 U/L (ref 10–35)
Albumin: 4.1 g/dL (ref 3.6–5.1)
Alkaline phosphatase (APISO): 84 U/L (ref 35–144)
BUN: 21 mg/dL (ref 7–25)
CO2: 23 mmol/L (ref 20–32)
Calcium: 9.5 mg/dL (ref 8.6–10.3)
Chloride: 105 mmol/L (ref 98–110)
Creat: 1.21 mg/dL (ref 0.70–1.28)
Globulin: 2.6 g/dL (ref 1.9–3.7)
Glucose, Bld: 124 mg/dL — ABNORMAL HIGH (ref 65–99)
Potassium: 3.9 mmol/L (ref 3.5–5.3)
Sodium: 138 mmol/L (ref 135–146)
Total Bilirubin: 0.5 mg/dL (ref 0.2–1.2)
Total Protein: 6.7 g/dL (ref 6.1–8.1)
eGFR: 64 mL/min/1.73m2

## 2021-12-02 LAB — CBC WITH DIFFERENTIAL/PLATELET
Absolute Monocytes: 813 cells/uL (ref 200–950)
Basophils Absolute: 30 cells/uL (ref 0–200)
Basophils Relative: 0.4 %
Eosinophils Absolute: 137 cells/uL (ref 15–500)
Eosinophils Relative: 1.8 %
HCT: 43.1 % (ref 38.5–50.0)
Hemoglobin: 14.7 g/dL (ref 13.2–17.1)
Lymphs Abs: 2379 cells/uL (ref 850–3900)
MCH: 30.7 pg (ref 27.0–33.0)
MCHC: 34.1 g/dL (ref 32.0–36.0)
MCV: 90 fL (ref 80.0–100.0)
MPV: 13.6 fL — ABNORMAL HIGH (ref 7.5–12.5)
Monocytes Relative: 10.7 %
Neutro Abs: 4241 cells/uL (ref 1500–7800)
Neutrophils Relative %: 55.8 %
Platelets: 154 10*3/uL (ref 140–400)
RBC: 4.79 10*6/uL (ref 4.20–5.80)
RDW: 12.7 % (ref 11.0–15.0)
Total Lymphocyte: 31.3 %
WBC: 7.6 10*3/uL (ref 3.8–10.8)

## 2021-12-02 LAB — HEMOGLOBIN A1C
Hgb A1c MFr Bld: 5.9 %{Hb} — ABNORMAL HIGH
Mean Plasma Glucose: 123 mg/dL
eAG (mmol/L): 6.8 mmol/L

## 2021-12-02 LAB — LIPID PANEL
Cholesterol: 99 mg/dL
HDL: 24 mg/dL — ABNORMAL LOW
LDL Cholesterol (Calc): 44 mg/dL
Non-HDL Cholesterol (Calc): 75 mg/dL
Total CHOL/HDL Ratio: 4.1 (calc)
Triglycerides: 247 mg/dL — ABNORMAL HIGH

## 2021-12-02 LAB — PSA: PSA: 0.45 ng/mL (ref <=4.00)

## 2021-12-02 LAB — TEST AUTHORIZATION

## 2021-12-05 NOTE — Telephone Encounter (Signed)
Rx send to pharmacy  

## 2021-12-11 ENCOUNTER — Encounter: Payer: Self-pay | Admitting: Family Medicine

## 2021-12-14 ENCOUNTER — Ambulatory Visit (INDEPENDENT_AMBULATORY_CARE_PROVIDER_SITE_OTHER): Payer: Medicare Other | Admitting: Family Medicine

## 2021-12-14 VITALS — BP 115/80 | HR 60 | Temp 97.3°F | Ht 70.0 in | Wt 225.0 lb

## 2021-12-14 DIAGNOSIS — R3914 Feeling of incomplete bladder emptying: Secondary | ICD-10-CM | POA: Diagnosis not present

## 2021-12-14 DIAGNOSIS — N401 Enlarged prostate with lower urinary tract symptoms: Secondary | ICD-10-CM

## 2021-12-14 MED ORDER — TAMSULOSIN HCL 0.4 MG PO CAPS: 0.4000 mg | ORAL_CAPSULE | Freq: Every day | ORAL | 3 refills | Status: DC

## 2021-12-14 NOTE — Progress Notes (Signed)
? ?Subjective:  ? ? Patient ID: Don Aguilar, male    DOB: Aug 05, 1951, 71 y.o.   MRN: [id number] ? ?HPI ?Recent PSA was 0.45.  Labs showed normal renal fcn and mild prediabetes with HgA1c of 5.9.  Patient reports weak urinary stream.  He states that he feels like he cannot empty his bladder.  He will go to the bathroom and stand there and the urine will come out or it will come out in a weak dribbling stream.  He does not feel like he is able to empty his bladder completely.  He denies any dysuria or hematuria.  He denies any fevers or chills or back pain.  This is been going on for some time. ?Past Medical History:  ?Diagnosis Date  ? Coronary artery disease   ? GERD (gastroesophageal reflux disease)   ? Hyperlipidemia   ? Hypertension 2010  ? ?Past Surgical History:  ?Procedure Laterality Date  ? CARDIAC CATHETERIZATION  06/08/2009  ? No intervention - recommend CABG  ? CARDIOVASCULAR STRESS TEST  05/30/2012  ? Small area of possible ischemia in basal septal wall. ECG negative for ischemia, but frequent PVCs continued from mid to peak exercise with  Bigeminy pattern.  ? CORONARY ARTERY BYPASS GRAFT  06/10/2009  ? x4. LIMA to LAD, SVG to first diag, SVG to circumflex, SVG to RCA. Endoscopic vein harvest from right thigh and right lower leg.  ? HAND TENDON SURGERY  left hand, 1972  ? TEE WITHOUT CARDIOVERSION  06/10/2009  ? EF 60-65%, Normal-trace findings  ? VASCULAR EVALUATION  06/09/2009  ? No significant extracranial carotid artery stenosis demonstrated. Vertebrals are patent with antegrade flow.  ? ?Current Outpatient Medications on File Prior to Visit  ?Medication Sig Dispense Refill  ? albuterol (VENTOLIN HFA) 108 (90 Base) MCG/ACT inhaler Inhale 2 puffs into the lungs every 6 (six) hours as needed for wheezing or shortness of breath. (Patient not taking: Reported on 11/28/2021) 18 g 1  ? aspirin 81 MG tablet Take 81 mg by mouth daily.    ? benzonatate (TESSALON) 100 MG capsule Take 1 capsule (100 mg total) by  mouth 2 (two) times daily as needed for cough. (Patient not taking: Reported on 11/28/2021) 20 capsule 0  ? finasteride (PROSCAR) 5 MG tablet Take 5 mg by mouth daily. (Patient not taking: Reported on 11/28/2021)    ? guaiFENesin-codeine 100-10 MG/5ML syrup 5-10 ml po every 6 hours prn cough (Patient not taking: Reported on 11/28/2021) 180 mL 0  ? hydrochlorothiazide (HYDRODIURIL) 25 MG tablet Take 1 tablet (25 mg total) by mouth daily. PT NEEDS OV FOR FURTHER REFILLS 30 tablet 0  ? metoprolol tartrate (LOPRESSOR) 50 MG tablet TAKE 1 & 1/2 (ONE & ONE-HALF) TABLETS BY MOUTH TWICE DAILY 270 tablet 0  ? omeprazole (PRILOSEC) 20 MG capsule Take 1 capsule (20 mg total) by mouth 2 (two) times daily. 60 capsule 1  ? ramipril (ALTACE) 10 MG capsule Take 1 capsule (10 mg total) by mouth daily. PT NEEDS OV FOR FURTHER REFILLS 30 capsule 0  ? rosuvastatin (CRESTOR) 40 MG tablet Take 1 tablet by mouth once daily 90 tablet 0  ? sildenafil (VIAGRA) 100 MG tablet Take 0.5-1 tablets (50-100 mg total) by mouth daily as needed for erectile dysfunction. 5 tablet 11  ? ?No current facility-administered medications on file prior to visit.  ? ?Allergies  ?Allergen Reactions  ? Niaspan [Niacin Er] Itching  ?  Lower legs  ? ?Social History  ? ?Socioeconomic  History  ? Marital status: Married  ?  Spouse name: Not on file  ? Number of children: Not on file  ? Years of education: Not on file  ? Highest education level: Not on file  ?Occupational History  ? Not on file  ?Tobacco Use  ? Smoking status: Former  ?  Packs/day: 2.00  ?  Years: 13.00  ?  Pack years: 26.00  ?  Types: Cigarettes  ?  Quit date: 03/15/1976  ?  Years since quitting: 45.7  ? Smokeless tobacco: Never  ?Substance and Sexual Activity  ? Alcohol use: No  ?  Alcohol/week: 0.0 standard drinks  ? Drug use: No  ? Sexual activity: Not on file  ?Other Topics Concern  ? Not on file  ?Social History Narrative  ? Not on file  ? ?Social Determinants of Health  ? ?Financial Resource Strain:  Not on file  ?Food Insecurity: Not on file  ?Transportation Needs: Not on file  ?Physical Activity: Not on file  ?Stress: Not on file  ?Social Connections: Not on file  ?Intimate Partner Violence: Not on file  ? ? ? ? ?Review of Systems  ?All other systems reviewed and are negative. ? ?   ?Objective:  ? Physical Exam ?Vitals reviewed.  ?Constitutional:   ?   Appearance: Normal appearance.  ?Cardiovascular:  ?   Rate and Rhythm: Normal rate and regular rhythm.  ?   Pulses: Normal pulses.  ?   Heart sounds: Normal heart sounds. No murmur heard. ?  No friction rub. No gallop.  ?Pulmonary:  ?   Effort: Pulmonary effort is normal. No respiratory distress.  ?   Breath sounds: Normal breath sounds. No stridor.  ?Abdominal:  ?   General: Bowel sounds are normal. There is no distension.  ?   Palpations: Abdomen is soft.  ?   Tenderness: There is no abdominal tenderness. There is no guarding or rebound.  ?Genitourinary: ?   Prostate: Enlarged. Not tender and no nodules present.  ?Musculoskeletal:  ?   Right lower leg: No edema.  ?   Left lower leg: No edema.  ?Neurological:  ?   Mental Status: He is alert.  ? ? ? ? ? ?   ?Assessment & Plan:  ?Benign prostatic hyperplasia with incomplete bladder emptying ?We will try Flomax 0.4 mg p.o. daily.  If not helping we will add finasteride.  I do not feel that this is overactive bladder given the weak urinary stream and incomplete bladder evacuation symptoms that the patient is having.  Symptoms do not suggest overactive bladder. ? ?

## 2021-12-16 ENCOUNTER — Other Ambulatory Visit: Payer: Self-pay | Admitting: Family Medicine

## 2021-12-17 ENCOUNTER — Encounter: Payer: Self-pay | Admitting: Family Medicine

## 2021-12-18 ENCOUNTER — Other Ambulatory Visit: Payer: Self-pay

## 2021-12-21 NOTE — Telephone Encounter (Signed)
Pt aware of Dr. Caren Macadam suggestion. Voice understanding ?

## 2022-03-24 ENCOUNTER — Other Ambulatory Visit: Payer: Self-pay | Admitting: Cardiovascular Disease

## 2022-05-01 ENCOUNTER — Other Ambulatory Visit: Payer: Self-pay | Admitting: Cardiovascular Disease

## 2022-06-14 ENCOUNTER — Other Ambulatory Visit: Payer: Self-pay | Admitting: Family Medicine

## 2022-06-14 NOTE — Telephone Encounter (Signed)
Patient has OV scheduled, will refill medication.  Requested Prescriptions  Pending Prescriptions Disp Refills  . ramipril (ALTACE) 10 MG capsule [Pharmacy Med Name: Ramipril 10 MG Oral Capsule] 90 capsule 0    Sig: Take 1 capsule by mouth once daily     Cardiovascular:  ACE Inhibitors Failed - 06/14/2022  3:32 PM      Failed - Cr in normal range and within 180 days    Creat  Date Value Ref Range Status  11/28/2021 1.21 0.70 - 1.28 mg/dL Final         Failed - K in normal range and within 180 days    Potassium  Date Value Ref Range Status  11/28/2021 3.9 3.5 - 5.3 mmol/L Final         Failed - Valid encounter within last 6 months    Recent Outpatient Visits          6 months ago Benign prostatic hyperplasia with incomplete bladder emptying   [place] Susy Frizzle, MD   6 months ago Hyperlipidemia with target LDL less than [place], Cammie Mcgee, MD   2 years ago Acute bronchitis, unspecified organism   [place], [place] Nunner[place], MD   2 years ago Acute bronchitis, unspecified organism   [place], [place], [place]   2 years ago Tinnitus aurium, bilateral   [place] Pickard, Cammie Mcgee, MD      Future Appointments            In 1 week Pickard, Cammie Mcgee, MD [place], [place] - Patient is not pregnant      Passed - Last BP in normal range    BP Readings from Last 1 Encounters:  12/14/21 115/80

## 2022-06-15 ENCOUNTER — Other Ambulatory Visit: Payer: Medicare Other

## 2022-06-15 DIAGNOSIS — E785 Hyperlipidemia, unspecified: Secondary | ICD-10-CM | POA: Diagnosis not present

## 2022-06-15 DIAGNOSIS — R739 Hyperglycemia, unspecified: Secondary | ICD-10-CM

## 2022-06-15 DIAGNOSIS — E669 Obesity, unspecified: Secondary | ICD-10-CM

## 2022-06-15 DIAGNOSIS — I10 Essential (primary) hypertension: Secondary | ICD-10-CM

## 2022-06-15 DIAGNOSIS — Z125 Encounter for screening for malignant neoplasm of prostate: Secondary | ICD-10-CM

## 2022-06-15 DIAGNOSIS — N401 Enlarged prostate with lower urinary tract symptoms: Secondary | ICD-10-CM

## 2022-06-16 LAB — PSA: PSA: 0.39 ng/mL (ref <=4.00)

## 2022-06-16 LAB — COMPLETE METABOLIC PANEL WITH GFR
AG Ratio: 1.6 (calc) (ref 1.0–2.5)
ALT: 22 U/L (ref 9–46)
AST: 22 U/L (ref 10–35)
Albumin: 4.1 g/dL (ref 3.6–5.1)
Alkaline phosphatase (APISO): 83 U/L (ref 35–144)
BUN: 17 mg/dL (ref 7–25)
CO2: 27 mmol/L (ref 20–32)
Calcium: 9.2 mg/dL (ref 8.6–10.3)
Chloride: 102 mmol/L (ref 98–110)
Creat: 1.13 mg/dL (ref 0.70–1.28)
Globulin: 2.5 g/dL (calc) (ref 1.9–3.7)
Glucose, Bld: 95 mg/dL (ref 65–99)
Potassium: 4.2 mmol/L (ref 3.5–5.3)
Sodium: 139 mmol/L (ref 135–146)
Total Bilirubin: 0.4 mg/dL (ref 0.2–1.2)
Total Protein: 6.6 g/dL (ref 6.1–8.1)
eGFR: 70 mL/min/{1.73_m2} (ref >=60)

## 2022-06-16 LAB — CBC WITH DIFFERENTIAL/PLATELET
Absolute Monocytes: 901 cells/uL (ref 200–950)
Basophils Absolute: 31 cells/uL (ref 0–200)
Basophils Relative: 0.4 %
Eosinophils Absolute: 123 cells/uL (ref 15–500)
Eosinophils Relative: 1.6 %
HCT: 45.1 % (ref 38.5–50.0)
Hemoglobin: 14.5 g/dL (ref 13.2–17.1)
Lymphs Abs: 2734 cells/uL (ref 850–3900)
MCH: 29.2 pg (ref 27.0–33.0)
MCHC: 32.2 g/dL (ref 32.0–36.0)
MCV: 90.7 fL (ref 80.0–100.0)
MPV: 12.2 fL (ref 7.5–12.5)
Monocytes Relative: 11.7 %
Neutro Abs: 3912 cells/uL (ref 1500–7800)
Neutrophils Relative %: 50.8 %
Platelets: 147 10*3/uL (ref 140–400)
RBC: 4.97 10*6/uL (ref 4.20–5.80)
RDW: 12.8 % (ref 11.0–15.0)
Total Lymphocyte: 35.5 %
WBC: 7.7 10*3/uL (ref 3.8–10.8)

## 2022-06-16 LAB — LIPID PANEL
Cholesterol: 97 mg/dL (ref <200)
HDL: 28 mg/dL — ABNORMAL LOW (ref >=40)
LDL Cholesterol (Calc): 44 mg/dL (calc)
Non-HDL Cholesterol (Calc): 69 mg/dL (calc) (ref <130)
Total CHOL/HDL Ratio: 3.5 (calc) (ref <5.0)
Triglycerides: 169 mg/dL — ABNORMAL HIGH (ref <150)

## 2022-06-16 LAB — HEMOGLOBIN A1C
Hgb A1c MFr Bld: 6.2 % of total Hgb — ABNORMAL HIGH (ref <5.7)
Mean Plasma Glucose: 131 mg/dL
eAG (mmol/L): 7.3 mmol/L

## 2022-06-22 ENCOUNTER — Other Ambulatory Visit: Payer: Self-pay | Admitting: Cardiovascular Disease

## 2022-06-22 ENCOUNTER — Ambulatory Visit (INDEPENDENT_AMBULATORY_CARE_PROVIDER_SITE_OTHER): Payer: Medicare Other | Admitting: Family Medicine

## 2022-06-22 ENCOUNTER — Encounter: Payer: Self-pay | Admitting: Family Medicine

## 2022-06-22 VITALS — BP 116/70 | HR 65 | Ht 70.0 in | Wt 225.6 lb

## 2022-06-22 DIAGNOSIS — I2510 Atherosclerotic heart disease of native coronary artery without angina pectoris: Secondary | ICD-10-CM | POA: Diagnosis not present

## 2022-06-22 DIAGNOSIS — Z0000 Encounter for general adult medical examination without abnormal findings: Secondary | ICD-10-CM | POA: Diagnosis not present

## 2022-06-22 DIAGNOSIS — E785 Hyperlipidemia, unspecified: Secondary | ICD-10-CM | POA: Diagnosis not present

## 2022-06-22 DIAGNOSIS — R7303 Prediabetes: Secondary | ICD-10-CM | POA: Diagnosis not present

## 2022-06-22 DIAGNOSIS — I10 Essential (primary) hypertension: Secondary | ICD-10-CM

## 2022-06-22 NOTE — Progress Notes (Signed)
Subjective:    Patient ID: Don Aguilar, male    DOB: 08-06-1951, 71 y.o.   MRN: [id number]  HPI Patient is a very pleasant 71 year old African-American gentleman who presents today for a physical exam.  He is already had his flu shot.  I recommended a COVID booster, I recommended the shingles vaccine, and also recommended Prevnar 20.  He declined Prevnar 20 today and defers the other shots.  His last colonoscopy per his report was last year although I do not have a copy of this.  It was reportedly normal.  His most recent PSA was excellent.  He does have BPH with lower urinary tract symptoms however the Flomax gave him a headache so he quit it.  Outside of that he is doing well with no concerns.  His most recent lab work is listed below Lab on 06/15/2022  Component Date Value Ref Range Status   WBC 06/15/2022 7.7  3.8 - 10.8 Thousand/uL Final   RBC 06/15/2022 4.97  4.20 - 5.80 Million/uL Final   Hemoglobin 06/15/2022 14.5  13.2 - 17.1 g/dL Final   HCT 06/15/2022 45.1  38.5 - 50.0 % Final   MCV 06/15/2022 90.7  80.0 - 100.0 fL Final   MCH 06/15/2022 29.2  27.0 - 33.0 pg Final   MCHC 06/15/2022 32.2  32.0 - 36.0 g/dL Final   RDW 06/15/2022 12.8  11.0 - 15.0 % Final   Platelets 06/15/2022 147  140 - 400 Thousand/uL Final   MPV 06/15/2022 12.2  7.5 - 12.5 fL Final   Neutro Abs 06/15/2022 3,912  1,500 - 7,800 cells/uL Final   Lymphs Abs 06/15/2022 2,734  850 - 3,900 cells/uL Final   Absolute Monocytes 06/15/2022 901  200 - 950 cells/uL Final   Eosinophils Absolute 06/15/2022 123  15 - 500 cells/uL Final   Basophils Absolute 06/15/2022 31  0 - 200 cells/uL Final   Neutrophils Relative % 06/15/2022 50.8  % Final   Total Lymphocyte 06/15/2022 35.5  % Final   Monocytes Relative 06/15/2022 11.7  % Final   Eosinophils Relative 06/15/2022 1.6  % Final   Basophils Relative 06/15/2022 0.4  % Final   Glucose, Bld 06/15/2022 95  65 - 99 mg/dL Final   Comment: .            Fasting reference  interval .    BUN 06/15/2022 17  7 - 25 mg/dL Final   Creat 06/15/2022 1.13  0.70 - 1.28 mg/dL Final   eGFR 06/15/2022 70  > OR = 60 mL/min/1.55m Final   BUN/Creatinine Ratio 06/15/2022 SEE NOTE:  6 - 22 (calc) Final   Comment:    Not Reported: BUN and Creatinine are within    reference range. .    Sodium 06/15/2022 139  135 - 146 mmol/L Final   Potassium 06/15/2022 4.2  3.5 - 5.3 mmol/L Final   Chloride 06/15/2022 102  98 - 110 mmol/L Final   CO2 06/15/2022 27  20 - 32 mmol/L Final   Calcium 06/15/2022 9.2  8.6 - 10.3 mg/dL Final   Total Protein 06/15/2022 6.6  6.1 - 8.1 g/dL Final   Albumin 06/15/2022 4.1  3.6 - 5.1 g/dL Final   Globulin 06/15/2022 2.5  1.9 - 3.7 g/dL (calc) Final   AG Ratio 06/15/2022 1.6  1.0 - 2.5 (calc) Final   Total Bilirubin 06/15/2022 0.4  0.2 - 1.2 mg/dL Final   Alkaline phosphatase (APISO) 06/15/2022 83  35 - 144 U/L Final  AST 06/15/2022 22  10 - 35 U/L Final   ALT 06/15/2022 22  9 - 46 U/L Final   Cholesterol 06/15/2022 97  <200 mg/dL Final   HDL 06/15/2022 28 (L)  > OR = 40 mg/dL Final   Triglycerides 06/15/2022 169 (H)  <150 mg/dL Final   LDL Cholesterol (Calc) 06/15/2022 44  mg/dL (calc) Final   Comment: Reference range: <100 . Desirable range <100 mg/dL for primary prevention;   <70 mg/dL for patients with CHD or diabetic patients  with > or = 2 CHD risk factors. Marland Kitchen LDL-C is now calculated using the Martin-Hopkins  calculation, which is a validated novel method providing  better accuracy than the Friedewald equation in the  estimation of LDL-C.  Cresenciano Genre et al. Annamaria Helling. 2725;366(44): 2061-2068  (http://education.[facility].com/faq/FAQ164)    Total CHOL/HDL Ratio 06/15/2022 3.5  <5.0 (calc) Final   Non-HDL Cholesterol (Calc) 06/15/2022 69  <130 mg/dL (calc) Final   Comment: For patients with diabetes plus 1 major ASCVD risk  factor, treating to a non-HDL-C goal of <100 mg/dL  (LDL-C of <70 mg/dL) is considered a therapeutic   option.    PSA 06/15/2022 0.39  < OR = 4.00 ng/mL Final   Comment: The total PSA value from this assay system is  standardized against the WHO standard. The test  result will be approximately 20% lower when compared  to the equimolar-standardized total PSA (Beckman  Coulter). Comparison of serial PSA results should be  interpreted with this fact in mind. . This test was performed using the Siemens  chemiluminescent method. Values obtained from  different assay methods cannot be used interchangeably. PSA levels, regardless of value, should not be interpreted as absolute evidence of the presence or absence of disease.    Hgb A1c MFr Bld 06/15/2022 6.2 (H)  <5.7 % of total Hgb Final   Comment: For someone without known diabetes, a hemoglobin  A1c value between 5.7% and 6.4% is consistent with prediabetes and should be confirmed with a  follow-up test. . For someone with known diabetes, a value <7% indicates that their diabetes is well controlled. A1c targets should be individualized based on duration of diabetes, age, comorbid conditions, and other considerations. . This assay result is consistent with an increased risk of diabetes. . Currently, no consensus exists regarding use of hemoglobin A1c for diagnosis of diabetes for children. .    Mean Plasma Glucose 06/15/2022 131  mg/dL Final   eAG (mmol/L) 06/15/2022 7.3  mmol/L Final    Past Medical History:  Diagnosis Date   Coronary artery disease    GERD (gastroesophageal reflux disease)    Hyperlipidemia    Hypertension 2010   Past Surgical History:  Procedure Laterality Date   CARDIAC CATHETERIZATION  06/08/2009   No intervention - recommend CABG   CARDIOVASCULAR STRESS TEST  05/30/2012   Small area of possible ischemia in basal septal wall. ECG negative for ischemia, but frequent PVCs continued from mid to peak exercise with  Bigeminy pattern.   CORONARY ARTERY BYPASS GRAFT  06/10/2009   x4. LIMA to LAD, SVG to  first diag, SVG to circumflex, SVG to RCA. Endoscopic vein harvest from right thigh and right lower leg.   HAND TENDON SURGERY  left hand, 1972   TEE WITHOUT CARDIOVERSION  06/10/2009   EF 60-65%, Normal-trace findings   VASCULAR EVALUATION  06/09/2009   No significant extracranial carotid artery stenosis demonstrated. Vertebrals are patent with antegrade flow.   Current Outpatient Medications on  File Prior to Visit  Medication Sig Dispense Refill   aspirin 81 MG tablet Take 81 mg by mouth daily.     hydrochlorothiazide (HYDRODIURIL) 25 MG tablet Take 1 tablet (25 mg total) by mouth daily. PT NEEDS OV FOR FURTHER REFILLS 30 tablet 0   metoprolol tartrate (LOPRESSOR) 50 MG tablet TAKE 1 & 1/2 (ONE & ONE-HALF) TABLETS BY MOUTH TWICE DAILY 270 tablet 0   omeprazole (PRILOSEC) 20 MG capsule Take 1 capsule (20 mg total) by mouth 2 (two) times daily. 60 capsule 1   ramipril (ALTACE) 10 MG capsule Take 1 capsule by mouth once daily 90 capsule 0   rosuvastatin (CRESTOR) 40 MG tablet Take 1 tablet by mouth once daily 90 tablet 0   albuterol (VENTOLIN HFA) 108 (90 Base) MCG/ACT inhaler Inhale 2 puffs into the lungs every 6 (six) hours as needed for wheezing or shortness of breath. (Patient not taking: Reported on 06/22/2022) 18 g 1   No current facility-administered medications on file prior to visit.   Allergies  Allergen Reactions   Niaspan [Niacin Er] Itching    Lower legs   Social History   Socioeconomic History   Marital status: Married    Spouse name: Not on file   Number of children: Not on file   Years of education: Not on file   Highest education level: Not on file  Occupational History   Not on file  Tobacco Use   Smoking status: Former    Packs/day: 2.00    Years: 13.00    Total pack years: 26.00    Types: Cigarettes    Quit date: 03/15/1976    Years since quitting: 46.3   Smokeless tobacco: Never  Substance and Sexual Activity   Alcohol use: No    Alcohol/week: 0.0  standard drinks of alcohol   Drug use: No   Sexual activity: Not on file  Other Topics Concern   Not on file  Social History Narrative   Not on file   Social Determinants of Health   Financial Resource Strain: Not on file  Food Insecurity: Not on file  Transportation Needs: Not on file  Physical Activity: Not on file  Stress: Not on file  Social Connections: Not on file  Intimate Partner Violence: Not on file      Review of Systems  All other systems reviewed and are negative.      Objective:   Physical Exam Vitals reviewed.  Constitutional:      Appearance: Normal appearance.  Cardiovascular:     Rate and Rhythm: Normal rate and regular rhythm.     Pulses: Normal pulses.     Heart sounds: Normal heart sounds. No murmur heard.    No friction rub. No gallop.  Pulmonary:     Effort: Pulmonary effort is normal. No respiratory distress.     Breath sounds: Normal breath sounds. No stridor.  Abdominal:     General: Bowel sounds are normal. There is no distension.     Palpations: Abdomen is soft.     Tenderness: There is no abdominal tenderness. There is no guarding or rebound.  Musculoskeletal:     Right lower leg: No edema.     Left lower leg: No edema.  Neurological:     Mental Status: He is alert.           Assessment & Plan:  General medical exam  Primary hypertension  Hyperlipidemia with target LDL less than 70  Coronary artery disease involving  native coronary artery of native heart without angina pectoris  Prediabetes Colon cancer screening and prostate cancer screening is up-to-date.  Flu shot is up-to-date.  Recommended the shingles vaccine, COVID shot, and Prevnar 20.  Lab work is outstanding.  Blood pressure is outstanding.  He denies any falls or depression.  He does have some mild age-related cognitive decline however he is at his baseline today.  Prediabetes does not require medication.  Blood pressure is well controlled as is his cholesterol

## 2022-07-20 ENCOUNTER — Other Ambulatory Visit: Payer: Self-pay | Admitting: Family Medicine

## 2022-07-23 NOTE — Telephone Encounter (Signed)
Requested Prescriptions  Pending Prescriptions Disp Refills   hydrochlorothiazide (HYDRODIURIL) 25 MG tablet [Pharmacy Med Name: hydroCHLOROthiazide 25 MG Oral Tablet] 90 tablet 1    Sig: TAKE 1 TABLET BY MOUTH ONCE DAILY. PT NEEDS OFFICE VISIT FOR FURTHER REFILLS.     Cardiovascular: Diuretics - Thiazide Failed - 07/20/2022  6:50 AM      Failed - Valid encounter within last 6 months    Recent Outpatient Visits           7 months ago Benign prostatic hyperplasia with incomplete bladder emptying   [facility] Donita Brooks, MD   7 months ago Hyperlipidemia with target LDL less than [address] [facility] Pickard, Priscille Heidelberg, MD   2 years ago Acute bronchitis, unspecified organism   [facility], Velna Hatchet, MD   2 years ago Acute bronchitis, unspecified organism   [facility] Jenne Pane, Rocky Crafts, FNP   2 years ago Tinnitus aurium, bilateral   [facility] Pickard, Priscille Heidelberg, MD              Passed - Cr in normal range and within 180 days    Creat  Date Value Ref Range Status  06/15/2022 1.13 0.70 - 1.28 mg/dL Final         Passed - K in normal range and within 180 days    Potassium  Date Value Ref Range Status  06/15/2022 4.2 3.5 - 5.3 mmol/L Final         Passed - Na in normal range and within 180 days    Sodium  Date Value Ref Range Status  06/15/2022 139 135 - 146 mmol/L Final  04/25/2017 143 134 - 144 mmol/L Final         Passed - Last BP in normal range    BP Readings from Last 1 Encounters:  06/22/22 116/70

## 2022-07-30 ENCOUNTER — Other Ambulatory Visit: Payer: Self-pay | Admitting: Cardiovascular Disease

## 2022-07-30 ENCOUNTER — Other Ambulatory Visit: Payer: Self-pay | Admitting: Family Medicine

## 2022-07-31 ENCOUNTER — Ambulatory Visit (INDEPENDENT_AMBULATORY_CARE_PROVIDER_SITE_OTHER): Payer: Medicare Other | Admitting: Family Medicine

## 2022-07-31 ENCOUNTER — Encounter: Payer: Self-pay | Admitting: Family Medicine

## 2022-07-31 VITALS — BP 120/70 | HR 72 | Temp 97.5°F | Ht 70.0 in | Wt 226.0 lb

## 2022-07-31 DIAGNOSIS — R208 Other disturbances of skin sensation: Secondary | ICD-10-CM | POA: Diagnosis not present

## 2022-07-31 NOTE — Progress Notes (Signed)
Acute Office Visit  Subjective:     Patient ID: Don Aguilar, male    DOB: Jan 21, 1951, 71 y.o.   MRN: [id number]  Chief Complaint  Patient presents with   Follow-up    possible shingles breakout    HPI Patient is in today for 2 weeks of pinching sensation or numbness from his right abdomen around to his right back described as "radiating" and worse at night. Denies fever, nausea, vomiting, diarrhea, abdominal pain. Has tried no medications. Had similar symptoms when he had shingles before.   Review of Systems  Reason unable to perform ROS: see HPI.  All other systems reviewed and are negative.   Past Medical History:  Diagnosis Date   Coronary artery disease    GERD (gastroesophageal reflux disease)    Hyperlipidemia    Hypertension 2010   Past Surgical History:  Procedure Laterality Date   CARDIAC CATHETERIZATION  06/08/2009   No intervention - recommend CABG   CARDIOVASCULAR STRESS TEST  05/30/2012   Small area of possible ischemia in basal septal wall. ECG negative for ischemia, but frequent PVCs continued from mid to peak exercise with  Bigeminy pattern.   CORONARY ARTERY BYPASS GRAFT  06/10/2009   x4. LIMA to LAD, SVG to first diag, SVG to circumflex, SVG to RCA. Endoscopic vein harvest from right thigh and right lower leg.   HAND TENDON SURGERY  left hand, 1972   TEE WITHOUT CARDIOVERSION  06/10/2009   EF 60-65%, Normal-trace findings   VASCULAR EVALUATION  06/09/2009   No significant extracranial carotid artery stenosis demonstrated. Vertebrals are patent with antegrade flow.   Current Outpatient Medications on File Prior to Visit  Medication Sig Dispense Refill   aspirin 81 MG tablet Take 81 mg by mouth daily.     hydrochlorothiazide (HYDRODIURIL) 25 MG tablet TAKE 1 TABLET BY MOUTH ONCE DAILY. PT NEEDS OFFICE VISIT FOR FURTHER REFILLS. 90 tablet 1   metoprolol tartrate (LOPRESSOR) 50 MG tablet TAKE 1 & 1/2 (ONE & ONE-HALF) TABLETS BY MOUTH TWICE DAILY .  APPOINTMENT WITH CARDIOLOGIST REQUIRED FOR FUTURE REFILLS 270 tablet 0   omeprazole (PRILOSEC) 20 MG capsule Take 1 capsule by mouth twice daily 60 capsule 0   ramipril (ALTACE) 10 MG capsule Take 1 capsule by mouth once daily 90 capsule 0   rosuvastatin (CRESTOR) 40 MG tablet TAKE 1 TABLET BY MOUTH ONCE DAILY . APPOINTMENT REQUIRED FOR FUTURE REFILLS 90 tablet 0   No current facility-administered medications on file prior to visit.   Allergies  Allergen Reactions   Niaspan [Niacin Er] Itching    Lower legs        Objective:    BP 120/70   Pulse 72   Temp (!) 97.5 F (36.4 C) (Oral)   Ht 5\' 10"  (1.778 m)   Wt 226 lb (102.5 kg)   SpO2 95%   BMI 32.43 kg/m    Physical Exam Vitals and nursing note reviewed.  Constitutional:      Appearance: Normal appearance. He is normal weight.  HENT:     Head: Normocephalic and atraumatic.  Abdominal:     General: Bowel sounds are normal.     Palpations: Abdomen is soft. There is no mass.     Tenderness: There is no abdominal tenderness. There is no right CVA tenderness, left CVA tenderness, guarding or rebound.     Hernia: No hernia is present.  Skin:    General: Skin is warm and dry.  Capillary Refill: Capillary refill takes less than 2 seconds.     Findings: No erythema, lesion or rash.  Neurological:     General: No focal deficit present.     Mental Status: He is alert and oriented to person, place, and time. Mental status is at baseline.  Psychiatric:        Mood and Affect: Mood normal.        Behavior: Behavior normal.        Thought Content: Thought content normal.        Judgment: Judgment normal.     No results found for any visits on 07/31/22.      Assessment & Plan:   Problem List Items Addressed This Visit       Other   Skin pain - Primary    Symptoms are consistent with herpes zoster prodrome as described, however there is currently no outbreak of the skin. No rash, bumps, or lesions. Instructed  patient to notify a healthcare provider within 72 hours of outbreak if he notices a rash or erythema and will order Valacyclovir. For now, try Tylenol or Ibuprofen and if symptoms persist or worsen return to office.       No orders of the defined types were placed in this encounter.   Return if symptoms worsen or fail to improve.  [name], FNP

## 2022-07-31 NOTE — Assessment & Plan Note (Addendum)
Symptoms are consistent with herpes zoster prodrome as described in the location of his previous outbreak, however there is currently no outbreak of the skin. No rash, bumps, or lesions. Instructed patient to notify a healthcare provider within 72 hours of outbreak if he notices a rash or erythema and will order Valacyclovir. For now, try Tylenol or Ibuprofen and if symptoms persist or worsen return to office.

## 2022-08-23 ENCOUNTER — Ambulatory Visit (INDEPENDENT_AMBULATORY_CARE_PROVIDER_SITE_OTHER): Payer: Medicare Other | Admitting: Family Medicine

## 2022-08-23 ENCOUNTER — Encounter: Payer: Self-pay | Admitting: Family Medicine

## 2022-08-23 VITALS — BP 124/76 | HR 64 | Ht 70.0 in | Wt 222.0 lb

## 2022-08-23 DIAGNOSIS — R1031 Right lower quadrant pain: Secondary | ICD-10-CM

## 2022-08-23 DIAGNOSIS — R109 Unspecified abdominal pain: Secondary | ICD-10-CM

## 2022-08-23 LAB — URINALYSIS, ROUTINE W REFLEX MICROSCOPIC
Bilirubin Urine: NEGATIVE
Glucose, UA: NEGATIVE
Hgb urine dipstick: NEGATIVE
Ketones, ur: NEGATIVE
Leukocytes,Ua: NEGATIVE
Nitrite: NEGATIVE
Protein, ur: NEGATIVE
Specific Gravity, Urine: 1.015 (ref 1.001–1.035)
pH: 5.5 (ref 5.0–8.0)

## 2022-08-23 NOTE — Progress Notes (Signed)
Subjective:    Patient ID: Don Aguilar, male    DOB: 04/09/1951, 71 y.o.   MRN: [id number]  HPI Patient reports a 1 month history of right flank pain that radiates into his bladder.  The pain comes and goes in waves.  Movement does not cause the pain.  Twisting his back does not cause the pain.  Bending over does not cause the pain.  He denies any dysuria or gross hematuria.  Urinalysis today is normal.  He denies any melena or hematochezia or constipation.  He denies any fevers or chills.  His abdomen is soft nontender nondistended with normal bowel sounds. Past Medical History:  Diagnosis Date   Coronary artery disease    GERD (gastroesophageal reflux disease)    Hyperlipidemia    Hypertension 2010   Past Surgical History:  Procedure Laterality Date   CARDIAC CATHETERIZATION  06/08/2009   No intervention - recommend CABG   CARDIOVASCULAR STRESS TEST  05/30/2012   Small area of possible ischemia in basal septal wall. ECG negative for ischemia, but frequent PVCs continued from mid to peak exercise with  Bigeminy pattern.   CORONARY ARTERY BYPASS GRAFT  06/10/2009   x4. LIMA to LAD, SVG to first diag, SVG to circumflex, SVG to RCA. Endoscopic vein harvest from right thigh and right lower leg.   HAND TENDON SURGERY  left hand, 1972   TEE WITHOUT CARDIOVERSION  06/10/2009   EF 60-65%, Normal-trace findings   VASCULAR EVALUATION  06/09/2009   No significant extracranial carotid artery stenosis demonstrated. Vertebrals are patent with antegrade flow.   Current Outpatient Medications on File Prior to Visit  Medication Sig Dispense Refill   aspirin 81 MG tablet Take 81 mg by mouth daily.     FLUAD QUADRIVALENT 0.5 ML injection      hydrochlorothiazide (HYDRODIURIL) 25 MG tablet TAKE 1 TABLET BY MOUTH ONCE DAILY. PT NEEDS OFFICE VISIT FOR FURTHER REFILLS. 90 tablet 1   metoprolol tartrate (LOPRESSOR) 50 MG tablet TAKE 1 & 1/2 (ONE & ONE-HALF) TABLETS BY MOUTH TWICE DAILY . APPOINTMENT  WITH CARDIOLOGIST REQUIRED FOR FUTURE REFILLS 270 tablet 0   omeprazole (PRILOSEC) 20 MG capsule Take 1 capsule by mouth twice daily 60 capsule 0   ramipril (ALTACE) 10 MG capsule Take 1 capsule by mouth once daily 90 capsule 0   rosuvastatin (CRESTOR) 40 MG tablet TAKE 1 TABLET BY MOUTH ONCE DAILY . APPOINTMENT REQUIRED FOR FUTURE REFILLS 90 tablet 0   No current facility-administered medications on file prior to visit.   Allergies  Allergen Reactions   Niaspan [Niacin Er] Itching    Lower legs   Social History   Socioeconomic History   Marital status: Married    Spouse name: Not on file   Number of children: Not on file   Years of education: Not on file   Highest education level: Not on file  Occupational History   Not on file  Tobacco Use   Smoking status: Former    Packs/day: 2.00    Years: 13.00    Total pack years: 26.00    Types: Cigarettes    Quit date: 03/15/1976    Years since quitting: 46.4   Smokeless tobacco: Never  Substance and Sexual Activity   Alcohol use: No    Alcohol/week: 0.0 standard drinks of alcohol   Drug use: No   Sexual activity: Not on file  Other Topics Concern   Not on file  Social History Narrative   Not  on file   Social Determinants of Health   Financial Resource Strain: Not on file  Food Insecurity: Not on file  Transportation Needs: Not on file  Physical Activity: Not on file  Stress: Not on file  Social Connections: Not on file  Intimate Partner Violence: Not on file      Review of Systems  All other systems reviewed and are negative.      Objective:   Physical Exam Vitals reviewed.  Constitutional:      Appearance: Normal appearance.  Cardiovascular:     Rate and Rhythm: Normal rate and regular rhythm.     Pulses: Normal pulses.     Heart sounds: Normal heart sounds. No murmur heard.    No friction rub. No gallop.  Pulmonary:     Effort: Pulmonary effort is normal. No respiratory distress.     Breath sounds:  Normal breath sounds. No stridor.  Abdominal:     General: Bowel sounds are normal. There is no distension.     Palpations: Abdomen is soft.     Tenderness: There is no abdominal tenderness. There is no guarding or rebound.  Musculoskeletal:     Right lower leg: No edema.     Left lower leg: No edema.  Neurological:     Mental Status: He is alert.           Assessment & Plan:  Side pain - Plan: Urinalysis, Routine w reflex microscopic, CT RENAL STONE STUDY  Right lower quadrant abdominal pain - Plan: CT RENAL STONE STUDY, CBC with Differential/Platelet, BASIC METABOLIC PANEL WITH GFR Patient's symptoms sound like he may be trying to pass a kidney stone.  The pain has been there now for more than a month.  Therefore I believe we need to proceed with imaging to determine if he has a kidney stone and also to determine the size to see if he needs urology consultation.  Also obtain a CBC to evaluate for leukocytosis and check a BMP to monitor renal function

## 2022-08-24 LAB — BASIC METABOLIC PANEL WITH GFR
BUN: 16 mg/dL (ref 7–25)
CO2: 27 mmol/L (ref 20–32)
Calcium: 8.7 mg/dL (ref 8.6–10.3)
Chloride: 103 mmol/L (ref 98–110)
Creat: 1.18 mg/dL (ref 0.70–1.28)
Glucose, Bld: 142 mg/dL — ABNORMAL HIGH (ref 65–99)
Potassium: 3.9 mmol/L (ref 3.5–5.3)
Sodium: 138 mmol/L (ref 135–146)
eGFR: 66 mL/min/{1.73_m2} (ref >=60)

## 2022-08-24 LAB — CBC WITH DIFFERENTIAL/PLATELET
Absolute Monocytes: 508 cells/uL (ref 200–950)
Basophils Absolute: 33 cells/uL (ref 0–200)
Basophils Relative: 0.5 %
Eosinophils Absolute: 99 cells/uL (ref 15–500)
Eosinophils Relative: 1.5 %
HCT: 42.4 % (ref 38.5–50.0)
Hemoglobin: 14.5 g/dL (ref 13.2–17.1)
Lymphs Abs: 1815 cells/uL (ref 850–3900)
MCH: 30.7 pg (ref 27.0–33.0)
MCHC: 34.2 g/dL (ref 32.0–36.0)
MCV: 89.6 fL (ref 80.0–100.0)
MPV: 12.4 fL (ref 7.5–12.5)
Monocytes Relative: 7.7 %
Neutro Abs: 4145 cells/uL (ref 1500–7800)
Neutrophils Relative %: 62.8 %
Platelets: 154 10*3/uL (ref 140–400)
RBC: 4.73 10*6/uL (ref 4.20–5.80)
RDW: 12.4 % (ref 11.0–15.0)
Total Lymphocyte: 27.5 %
WBC: 6.6 10*3/uL (ref 3.8–10.8)

## 2022-08-29 ENCOUNTER — Ambulatory Visit (HOSPITAL_COMMUNITY)
Admission: RE | Admit: 2022-08-29 | Discharge: 2022-08-29 | Disposition: A | Discharge status: Home / Self Care | Payer: Medicare Other | Source: Ambulatory Visit | Attending: Family Medicine | Admitting: Family Medicine

## 2022-08-29 DIAGNOSIS — R109 Unspecified abdominal pain: Secondary | ICD-10-CM | POA: Insufficient documentation

## 2022-08-29 DIAGNOSIS — R1031 Right lower quadrant pain: Secondary | ICD-10-CM | POA: Diagnosis not present

## 2022-08-29 DIAGNOSIS — K429 Umbilical hernia without obstruction or gangrene: Secondary | ICD-10-CM | POA: Diagnosis not present

## 2022-08-29 DIAGNOSIS — I700 Atherosclerosis of aorta: Secondary | ICD-10-CM | POA: Diagnosis not present

## 2022-09-04 ENCOUNTER — Telehealth: Payer: Self-pay | Admitting: Family Medicine

## 2022-09-04 NOTE — Telephone Encounter (Signed)
Patient's wife stated patient's test for kidney stones was negative; requesting call with next best steps to take.   Please advise at [phone].

## 2022-09-13 ENCOUNTER — Other Ambulatory Visit: Payer: Self-pay | Admitting: Family Medicine

## 2022-09-13 NOTE — Telephone Encounter (Signed)
Requested Prescriptions  Pending Prescriptions Disp Refills   ramipril (ALTACE) 10 MG capsule [Pharmacy Med Name: Ramipril 10 MG Oral Capsule] 90 capsule 0    Sig: Take 1 capsule by mouth once daily     Cardiovascular:  ACE Inhibitors Failed - 09/13/2022  1:23 PM      Failed - Valid encounter within last 6 months    Recent Outpatient Visits           9 months ago Benign prostatic hyperplasia with incomplete bladder emptying   [place] Susy Frizzle, MD   9 months ago Hyperlipidemia with target LDL less than [place] Pickard, Cammie Mcgee, MD   2 years ago Acute bronchitis, unspecified organism   [place], [place] Nunnery, MD   2 years ago Acute bronchitis, unspecified organism   [place], [place], FNP   2 years ago Tinnitus aurium, bilateral   [place] Pickard, Cammie Mcgee, MD              Passed - Cr in normal range and within 180 days    Creat  Date Value Ref Range Status  08/23/2022 1.18 0.70 - 1.28 mg/dL Final         Passed - K in normal range and within 180 days    Potassium  Date Value Ref Range Status  08/23/2022 3.9 3.5 - 5.3 mmol/L Final         Passed - Patient is not pregnant      Passed - Last BP in normal range    BP Readings from Last 1 Encounters:  08/23/22 124/76          omeprazole (PRILOSEC) 20 MG capsule [Pharmacy Med Name: Omeprazole 20 MG Oral Capsule Delayed Release] 60 capsule 2    Sig: Take 1 capsule by mouth twice daily     Gastroenterology: Proton Pump Inhibitors Passed - 09/13/2022  1:23 PM      Passed - Valid encounter within last 12 months    Recent Outpatient Visits           9 months ago Benign prostatic hyperplasia with incomplete bladder emptying   [place] Susy Frizzle, MD   9 months ago Hyperlipidemia with target LDL less than [place], Cammie Mcgee, MD   2  years ago Acute bronchitis, unspecified organism   [place], [place] Nunnery, MD   2 years ago Acute bronchitis, unspecified organism   [place], [place], FNP   2 years ago Tinnitus aurium, bilateral   [place] Pickard, Cammie Mcgee, MD

## 2022-09-18 ENCOUNTER — Encounter: Payer: Self-pay | Admitting: Family Medicine

## 2022-09-18 ENCOUNTER — Ambulatory Visit (INDEPENDENT_AMBULATORY_CARE_PROVIDER_SITE_OTHER): Payer: Medicare Other | Admitting: Family Medicine

## 2022-09-18 VITALS — BP 128/84 | HR 60 | Temp 98.1°F | Ht 70.0 in | Wt 223.0 lb

## 2022-09-18 DIAGNOSIS — R109 Unspecified abdominal pain: Secondary | ICD-10-CM | POA: Diagnosis not present

## 2022-09-18 NOTE — Progress Notes (Signed)
Subjective:    Patient ID: Don Aguilar, male    DOB: 1951-06-17, 72 y.o.   MRN: [id number]  HPI I saw the patient in December for 1 month history of right side pain.  Based on his history I was concerned about kidney stones.  However the patient had a CT scan of the abdomen and pelvis that showed no abnormalities.  He continues to have a right-sided pain.  However now the pain has changed in characteristic.  He states that it starts in his right flank and radiates around underneath his ribs to his right upper quadrant right abdomen area.  He describes it as a sharp stinging pain.  He states it feels like it starts on the surface and it radiates deep underneath the skin.  There is no tenderness to palpation and.  There is no visible rash in that area.  He denies any fevers or chills.  He denies any nausea or vomiting.  He denies any melena or hematochezia. Past Medical History:  Diagnosis Date   Coronary artery disease    GERD (gastroesophageal reflux disease)    Hyperlipidemia    Hypertension 2010   Past Surgical History:  Procedure Laterality Date   CARDIAC CATHETERIZATION  06/08/2009   No intervention - recommend CABG   CARDIOVASCULAR STRESS TEST  05/30/2012   Small area of possible ischemia in basal septal wall. ECG negative for ischemia, but frequent PVCs continued from mid to peak exercise with  Bigeminy pattern.   CORONARY ARTERY BYPASS GRAFT  06/10/2009   x4. LIMA to LAD, SVG to first diag, SVG to circumflex, SVG to RCA. Endoscopic vein harvest from right thigh and right lower leg.   HAND TENDON SURGERY  left hand, 1972   TEE WITHOUT CARDIOVERSION  06/10/2009   EF 60-65%, Normal-trace findings   VASCULAR EVALUATION  06/09/2009   No significant extracranial carotid artery stenosis demonstrated. Vertebrals are patent with antegrade flow.   Current Outpatient Medications on File Prior to Visit  Medication Sig Dispense Refill   aspirin 81 MG tablet Take 81 mg by mouth daily.      FLUAD QUADRIVALENT 0.5 ML injection      hydrochlorothiazide (HYDRODIURIL) 25 MG tablet TAKE 1 TABLET BY MOUTH ONCE DAILY. PT NEEDS OFFICE VISIT FOR FURTHER REFILLS. 90 tablet 1   metoprolol tartrate (LOPRESSOR) 50 MG tablet TAKE 1 & 1/2 (ONE & ONE-HALF) TABLETS BY MOUTH TWICE DAILY . APPOINTMENT WITH CARDIOLOGIST REQUIRED FOR FUTURE REFILLS 270 tablet 0   omeprazole (PRILOSEC) 20 MG capsule Take 1 capsule by mouth twice daily 60 capsule 2   ramipril (ALTACE) 10 MG capsule Take 1 capsule by mouth once daily 90 capsule 0   rosuvastatin (CRESTOR) 40 MG tablet TAKE 1 TABLET BY MOUTH ONCE DAILY . APPOINTMENT REQUIRED FOR FUTURE REFILLS 90 tablet 0   SPIKEVAX syringe      No current facility-administered medications on file prior to visit.   Allergies  Allergen Reactions   Niaspan [Niacin Er] Itching    Lower legs   Social History   Socioeconomic History   Marital status: Married    Spouse name: Not on file   Number of children: Not on file   Years of education: Not on file   Highest education level: Not on file  Occupational History   Not on file  Tobacco Use   Smoking status: Former    Packs/day: 2.00    Years: 13.00    Total pack years: 26.00  Types: Cigarettes    Quit date: 03/15/1976    Years since quitting: 46.5   Smokeless tobacco: Never  Substance and Sexual Activity   Alcohol use: No    Alcohol/week: 0.0 standard drinks of alcohol   Drug use: No   Sexual activity: Not on file  Other Topics Concern   Not on file  Social History Narrative   Not on file   Social Determinants of Health   Financial Resource Strain: Not on file  Food Insecurity: Not on file  Transportation Needs: Not on file  Physical Activity: Not on file  Stress: Not on file  Social Connections: Not on file  Intimate Partner Violence: Not on file      Review of Systems  All other systems reviewed and are negative.      Objective:   Physical Exam Vitals reviewed.  Constitutional:       Appearance: Normal appearance.  Cardiovascular:     Rate and Rhythm: Normal rate and regular rhythm.     Pulses: Normal pulses.     Heart sounds: Normal heart sounds. No murmur heard.    No friction rub. No gallop.  Pulmonary:     Effort: Pulmonary effort is normal. No respiratory distress.     Breath sounds: Normal breath sounds. No stridor.    Chest:    Abdominal:     General: Bowel sounds are normal. There is no distension.     Palpations: Abdomen is soft.     Tenderness: There is no abdominal tenderness. There is no guarding or rebound.  Musculoskeletal:     Right lower leg: No edema.     Left lower leg: No edema.  Neurological:     Mental Status: He is alert.           Assessment & Plan:  Right sided abdominal pain CAT scan was reassuring.  Differential diagnosis for his pain includes musculoskeletal chest wall abdominal wall pain, thoracic radiculopathy, or gallstones with an atypical presentation.  I favor thoracic radiculopathy.  I recommended an MRI of the thoracic spine however the pain is improving so the patient elects to monitor to see if it will gradually go away.  Pain has been present for 6 to 8 weeks now.  This would follow the anticipated course of a pinched nerve in the back as 90% typically improved in 6 weeks or so.

## 2022-09-24 ENCOUNTER — Other Ambulatory Visit: Payer: Self-pay | Admitting: Cardiovascular Disease

## 2022-09-26 ENCOUNTER — Telehealth: Payer: Self-pay | Admitting: Cardiovascular Disease

## 2022-09-26 NOTE — Telephone Encounter (Signed)
*  STAT* If patient is at the pharmacy, call can be transferred to refill team.   1. Which medications need to be refilled? (please list name of each medication and dose if known) metoprolol tartrate (LOPRESSOR) 50 MG tablet   2. Which pharmacy/location (including street and city if local pharmacy) is medication to be sent to? [place] - [address]   3. Do they need a 30 day or 90 day supply? 90   Patient is out of medication. Has appt on 2/2 at 1:30

## 2022-09-27 MED ORDER — METOPROLOL TARTRATE 50 MG PO TABS: ORAL_TABLET | ORAL | 0 refills | Status: DC

## 2022-09-27 NOTE — Telephone Encounter (Signed)
Spoke with patient via phone and patient is aware that he must attend 09/28/2022 appointment to receive future refills.

## 2022-09-28 ENCOUNTER — Encounter: Payer: Self-pay | Admitting: Cardiovascular Disease

## 2022-09-28 ENCOUNTER — Ambulatory Visit: Payer: Medicare Other | Attending: Cardiovascular Disease | Admitting: Cardiovascular Disease

## 2022-09-28 VITALS — BP 132/62 | HR 69 | Ht 69.0 in | Wt 224.2 lb

## 2022-09-28 DIAGNOSIS — G4733 Obstructive sleep apnea (adult) (pediatric): Secondary | ICD-10-CM | POA: Diagnosis not present

## 2022-09-28 DIAGNOSIS — E669 Obesity, unspecified: Secondary | ICD-10-CM

## 2022-09-28 DIAGNOSIS — E785 Hyperlipidemia, unspecified: Secondary | ICD-10-CM | POA: Diagnosis not present

## 2022-09-28 DIAGNOSIS — K219 Gastro-esophageal reflux disease without esophagitis: Secondary | ICD-10-CM | POA: Diagnosis not present

## 2022-09-28 DIAGNOSIS — Z951 Presence of aortocoronary bypass graft: Secondary | ICD-10-CM

## 2022-09-28 DIAGNOSIS — I2510 Atherosclerotic heart disease of native coronary artery without angina pectoris: Secondary | ICD-10-CM

## 2022-09-28 DIAGNOSIS — I493 Ventricular premature depolarization: Secondary | ICD-10-CM | POA: Diagnosis not present

## 2022-09-28 DIAGNOSIS — I498 Other specified cardiac arrhythmias: Secondary | ICD-10-CM | POA: Diagnosis not present

## 2022-09-28 MED ORDER — METOPROLOL TARTRATE 100 MG PO TABS: 100.0000 mg | ORAL_TABLET | Freq: Two times a day (BID) | ORAL | 3 refills | Status: DC

## 2022-09-28 NOTE — Patient Instructions (Addendum)
Medication Instructions:  INCREASE metoprolol tartrate (Lopressor) to 100 mg two times daily  *If you need a refill on your cardiac medications before your next appointment, please call your pharmacy*  Testing/Procedures: Your physician has requested that you have an echocardiogram. Echocardiography is a painless test that uses sound waves to create images of your heart. It provides your doctor with information about the size and shape of your heart and how well your heart's chambers and valves are working. This procedure takes approximately one hour. There are no restrictions for this procedure. Please do NOT wear cologne, perfume, aftershave, or lotions (deodorant is allowed). Please arrive 15 minutes prior to your appointment time.  Follow-Up: At [facility], you and your health needs are our priority.  As part of our continuing mission to provide you with exceptional heart care, we have created designated Provider Care Teams.  These Care Teams include your primary Cardiologist (physician) and Advanced Practice Providers (APPs -  Physician Assistants and Nurse Practitioners) who all work together to provide you with the care you need, when you need it.  We recommend signing up for the patient portal called "MyChart".  Sign up information is provided on this After Visit Summary.  MyChart is used to connect with patients for Virtual Visits (Telemedicine).  Patients are able to view lab/test results, encounter notes, upcoming appointments, etc.  Non-urgent messages can be sent to your provider as well.   To learn more about what you can do with MyChart, go to [email or web address].    Your next appointment:   2 month(s)  Provider:   Shelva Majestic, MD

## 2022-09-28 NOTE — Progress Notes (Signed)
Cardiology Office Note    Date:  09/28/2022   ID:  TREJUAN, MATHERNE 07-26-1951, MRN [id number]  PCP:  Susy Frizzle, MD  Cardiologist:  Shelva Majestic, MD   No chief complaint on file.   History of Present Illness:  Don Aguilar is a 72 y.o. male who I have not evaluated since a telemedicine visit in April 2020.  Don Aguilar was found to have severe multivessel CAD in October 2010 and underwent CABG revascularization surgery with Dr. Lucianne Lei trite with a LIMA to LAD, SVG to first diagonal, SVG to circumflex marginal, and SVG to distal RCA.  Patient problems included hypertension, obesity, mixed hyperlipidemia.  In 2013 he was found to have mild obstructive sleep apnea with an AHI of 8.5/h, RDI was 25.4/h and during REM sleep AHI was 10.8/h.  He underwent CPAP titration study and 11 cm water pressure was recommended but he never followed up with CPAP therapy.  Saw him in 2015, he was working as a Air traffic controller at [employer].  He had issues of palpitations and shortness of breath with walking up steps.  At that time he was on metoprolol tartrate 150 mg twice a day which controlled his palpitations.  Apparently when he went for renewal he was inadvertently only given 25 mg.  His follow-up, I recommended further titration to twice a day and he noted resolution of his prior palpitations.  An echo Doppler study on January 31, 2017 showed EF 60 to 65% with normal diastolic function.  Atrial septal thickness was increased consistent with lipomatous hypertrophy.  I have not seen him in the office since his April 09, 2017 evaluation.  He was evaluated virtually on November 28, 2018 for the COVID pandemic.  At that time he felt he was sleeping well.  Palpitations were controlled.  He was evaluated by Fabian Sharp PA in a virtual visit on September 02, 2020.  At that time he was noticing some more palpitations.  A Zio patch monitor was ordered and his predominant rhythm was sinus with an  average rate at 69 bpm minimum heart rate of 51.  He had 3 episodes of nonsustained VT.  The episode with the fastest and longest interval lasted 8 beats with an average rate of 176.  There were rare isolated PACs and atrial couplets.  He had frequent PVCs with rare couplets and triplets.  There were episodes of ventricular bigeminy and trigeminy.  There were no episodes of atrial fibrillation or pauses.  Apparently there were multiple attempts at contacting him following this evaluation with a message to call back as well as placing a letter in the mail.  Apparently there was no follow-up.  Presently, he continues to be by Dr. Dennard Schaumann at [facility].  Denies any chest pain or shortness of breath.  At times he notes some irregular rhythm but essentially does not pay attention to it.  He believes he is sleeping fairly well typically going to bed around 1030 and waking up at 630.  Has had some mild weight gain.  He is not as active.  He is fully retired.  He underwent laboratory Dr. Dennard Schaumann on June 15, 2022 which showed total cholesterol 97 HDL 28 LDL 44 triglycerides 169 on rosuvastatin 40 mg.  Hemoglobin A1c was 6.2.  12/22/2021 creatinine was 1.18.  Potassium 3.9.  Currently he is on metoprolol tartrate just at 75 mg twice a day and takes HCTZ 25  mg as needed for fluid and ramipril 10 mg daily for blood pressure control.  He also takes omeprazole and is on baby aspirin.  He denies any recurrent chest pain.  He denies presyncope or syncope.  He denies significant leg edema.  He presents for evaluation.    Past Medical History:  Diagnosis Date   Coronary artery disease    GERD (gastroesophageal reflux disease)    Hyperlipidemia    Hypertension 2010    Past Surgical History:  Procedure Laterality Date   CARDIAC CATHETERIZATION  06/08/2009   No intervention - recommend CABG   CARDIOVASCULAR STRESS TEST  05/30/2012   Small area of possible ischemia in basal septal wall. ECG negative  for ischemia, but frequent PVCs continued from mid to peak exercise with  Bigeminy pattern.   CORONARY ARTERY BYPASS GRAFT  06/10/2009   x4. LIMA to LAD, SVG to first diag, SVG to circumflex, SVG to RCA. Endoscopic vein harvest from right thigh and right lower leg.   HAND TENDON SURGERY  left hand, 1972   TEE WITHOUT CARDIOVERSION  06/10/2009   EF 60-65%, Normal-trace findings   VASCULAR EVALUATION  06/09/2009   No significant extracranial carotid artery stenosis demonstrated. Vertebrals are patent with antegrade flow.    Current Medications: Outpatient Medications Prior to Visit  Medication Sig Dispense Refill   aspirin 81 MG tablet Take 81 mg by mouth daily.     FLUAD QUADRIVALENT 0.5 ML injection      hydrochlorothiazide (HYDRODIURIL) 25 MG tablet TAKE 1 TABLET BY MOUTH ONCE DAILY. PT NEEDS OFFICE VISIT FOR FURTHER REFILLS. 90 tablet 1   omeprazole (PRILOSEC) 20 MG capsule Take 1 capsule by mouth twice daily 60 capsule 2   ramipril (ALTACE) 10 MG capsule Take 1 capsule by mouth once daily 90 capsule 0   rosuvastatin (CRESTOR) 40 MG tablet TAKE 1 TABLET BY MOUTH ONCE DAILY . APPOINTMENT REQUIRED FOR FUTURE REFILLS 90 tablet 0   SPIKEVAX syringe      metoprolol tartrate (LOPRESSOR) 50 MG tablet TAKE 1 &1/2 (ONE & ONE-HALF) TABLETS BY MOUTH TWICE DAILY. MUST ATTEND FUTURE APPOINTMENT FOR FUTURE REFILLS 150 tablet 0   No facility-administered medications prior to visit.     Allergies:   Niaspan [niacin er]   Social History   Socioeconomic History   Marital status: Married    Spouse name: Not on file   Number of children: Not on file   Years of education: Not on file   Highest education level: Not on file  Occupational History   Not on file  Tobacco Use   Smoking status: Former    Packs/day: 2.00    Years: 13.00    Total pack years: 26.00    Types: Cigarettes    Quit date: 03/15/1976    Years since quitting: 46.5   Smokeless tobacco: Never  Substance and Sexual Activity    Alcohol use: No    Alcohol/week: 0.0 standard drinks of alcohol   Drug use: No   Sexual activity: Not on file  Other Topics Concern   Not on file  Social History Narrative   Not on file   Social Determinants of Health   Financial Resource Strain: Not on file  Food Insecurity: Not on file  Transportation Needs: Not on file  Physical Activity: Not on file  Stress: Not on file  Social Connections: Not on file    Also history notable that he is married and 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  He has 3 children and several grandchildren.  Family History:  The patient's family history includes Cancer in his sister; Heart failure in his father and mother.   ROS General: Negative; No fevers, chills, or night sweats;  HEENT: Negative; No changes in vision or hearing, sinus congestion, difficulty swallowing Pulmonary: Negative; No cough, wheezing, shortness of breath, hemoptysis Cardiovascular: Negative; No chest pain, presyncope, syncope, palpitations GI: Negative; No nausea, vomiting, diarrhea, or abdominal pain GU: Negative; No dysuria, hematuria, or difficulty voiding Musculoskeletal: Negative; no myalgias, joint pain, or weakness Hematologic/Oncology: Negative; no easy bruising, bleeding Endocrine: Negative; no heat/cold intolerance; no diabetes Neuro: Negative; no changes in balance, headaches Skin: Negative; No rashes or skin lesions Psychiatric: Negative; No behavioral problems, depression Sleep: Negative; No snoring, daytime sleepiness, hypersomnolence, bruxism, restless legs, hypnogognic hallucinations, no cataplexy Other comprehensive 14 point system review is negative.   PHYSICAL EXAM:   VS:  BP 132/62   Pulse 69   Ht 5\' 9"  (1.753 m)   Wt 224 lb 3.2 oz (101.7 kg)   SpO2 98%   BMI 33.11 kg/m    Repeat blood pressure by me was 126/64.   Wt Readings from Last 3 Encounters:  09/28/22 224 lb 3.2 oz (101.7 kg)  09/18/22 223 lb (101.2 kg)   08/23/22 222 lb (100.7 kg)    General: Alert, oriented, no distress.  Skin: normal turgor, no rashes, warm and dry HEENT: Normocephalic, atraumatic. Pupils equal round and reactive to light; sclera anicteric; extraocular muscles intact;  Nose without nasal septal hypertrophy Mouth/Parynx benign; Mallinpatti scale Neck: No JVD, no carotid bruits; normal carotid upstroke Lungs: clear to ausculatation and percussion; no wheezing or rales Chest wall: without tenderness to palpitation Heart: PMI not displaced, bigeminal rhythm with pulse in the upper 60s, s1 s2 normal, 1/6 systolic murmur, no diastolic murmur, no rubs, gallops, thrills, or heaves Abdomen: soft, nontender; no hepatosplenomehaly, BS+; abdominal aorta nontender and not dilated by palpation. Back: no CVA tenderness Pulses 2+ Musculoskeletal: full range of motion, normal strength, no joint deformities Extremities: no clubbing cyanosis or edema, Homan's sign negative  Neurologic: grossly nonfocal; Cranial nerves grossly wnl Psychologic: Normal mood and affect   Studies/Labs Reviewed:   September 28, 2022 2024 ECG (independently read by me): Sinus rhythm at 69; PVC with ventricular bigeminy, inferior and anterolateral T wave abnormality  April 09, 2017 ECG (independently read by me): Rhythm at 60 bpm with anterolateral T wave changes.  Recent Labs:    Latest Ref Rng & Units 08/23/2022   11:24 AM 06/15/2022    8:23 AM 11/28/2021    3:39 PM  BMP  Glucose 65 - 99 mg/dL 142  95  124   BUN 7 - 25 mg/dL 16  17  21    Creatinine 0.70 - 1.28 mg/dL 1.18  1.13  1.21   BUN/Creat Ratio 6 - 22 (calc) SEE NOTE:  SEE NOTE:  NOT APPLICABLE   Sodium 388 - 146 mmol/L 138  139  138   Potassium 3.5 - 5.3 mmol/L 3.9  4.2  3.9   Chloride 98 - 110 mmol/L 103  102  105   CO2 20 - 32 mmol/L 27  27  23    Calcium 8.6 - 10.3 mg/dL 8.7  9.2  9.5         Latest Ref Rng & Units 06/15/2022    8:23 AM 11/28/2021    3:39 PM 03/26/2019   12:43 PM   Hepatic Function  Total Protein  6.1 - 8.1 g/dL 6.6  6.7  6.6   AST 10 - 35 U/L 22  27  23    ALT 9 - 46 U/L 22  21  21    Total Bilirubin 0.2 - 1.2 mg/dL 0.4  0.5  0.4        Latest Ref Rng & Units 08/23/2022   11:24 AM 06/15/2022    8:23 AM 11/28/2021    3:39 PM  CBC  WBC 3.8 - 10.8 Thousand/uL 6.6  7.7  7.6   Hemoglobin 13.2 - 17.1 g/dL 06/17/2022  01/28/2022  62.9   Hematocrit 38.5 - 50.0 % 42.4  45.1  43.1   Platelets 140 - 400 Thousand/uL 154  147  154    Lab Results  Component Value Date   MCV 89.6 08/23/2022   MCV 90.7 06/15/2022   MCV 90.0 11/28/2021   Lab Results  Component Value Date   TSH 2.76 01/30/2017   Lab Results  Component Value Date   HGBA1C 6.2 (H) 06/15/2022     BNP No results found for: "BNP"  ProBNP No results found for: "PROBNP"   Lipid Panel     Component Value Date/Time   CHOL 97 06/15/2022 0823   CHOL 88 (L) 04/25/2017 0832   TRIG 169 (H) 06/15/2022 0823   HDL 28 (L) 06/15/2022 0823   HDL 31 (L) 04/25/2017 0832   CHOLHDL 3.5 06/15/2022 0823   VLDL 47 (H) 01/30/2017 0930   LDLCALC 44 06/15/2022 0823   LABVLDL 20 04/25/2017 0832     RADIOLOGY: No results found.   Additional studies/ records that were reviewed today include:  Reviewed the patient's prior records.  Telemedicine visit from 06/17/2022 were reviewed was reviewed as well as recent evaluation by Dr. 04/27/2017 from June 22, 2022.  ASSESSMENT:    1. CAD in native artery   2. S/P CABG (coronary artery bypass graft)   3. Frequent PVCs   4. Ventricular bigeminy   5. Hyperlipidemia with target LDL less than 70   6. Obesity (BMI 30.0-34.9)   7. Gastroesophageal reflux disease without esophagitis   8. Very mild/borderline OSA (obstructive sleep apnea)     PLAN:  Don Aguilar OSA 72 year old African-American male who is status post CABG revascularization surgery in October 2010 with a LIMA to LAD, SVG to first diagonal, SVG to circumflex and SVG to distal RCA.  He  has had a history of palpitations and in the past this had been controlled with metoprolol tartrate previously up to 150 twice a day.  However, over the past appears he has just been on 75 mg twice a day.  Echo Doppler study in June 2018 showed EF 60 to 65% with moderate LVH without significant valvular pathology.  His blood pressure today is controlled.  His ECG shows a ventricular bigeminal rhythm with average rate at 69 bpm and T wave abnormality inferiorly and anterolaterally which have been present previously.  TC intervals 437 ms and PR interval 138 ms.  Blood pressure today is stable on his current therapy of chlorothiazide which he takes as needed, ramipril 10 mg in addition to metoprolol tartrate 75 mg twice a day.  I am recommending further titration of metoprolol to 100 mg twice a day.  I am scheduling him for a follow-up echo Doppler study for reassessment of LV function particularly with his probable longstanding ventricular ectopy.  He had recently undergone laboratory which I have reviewed.  He continues to be on  rosuvastatin 40 mg for hyperlipidemia with most recent LDL excellent at 44.  He believes he is sleeping well typically goes to bed and wakes up at 630.  He does not have any anginal symptoms or change in exercise capacity.  He is on omeprazole for GERD.  I will see him in follow-up in approximately 2 months for reassessment and further recommendations will be made at that time.   Medication Adjustments/Labs and Tests Ordered: Current medicines are reviewed at length with the patient today.  Concerns regarding medicines are outlined above.  Medication changes, Labs and Tests ordered today are listed in the Patient Instructions below. Patient Instructions  Medication Instructions:  INCREASE metoprolol tartrate (Lopressor) to 100 mg two times daily  *If you need a refill on your cardiac medications before your next appointment, please call your pharmacy*  Testing/Procedures: Your  physician has requested that you have an echocardiogram. Echocardiography is a painless test that uses sound waves to create images of your heart. It provides your doctor with information about the size and shape of your heart and how well your heart's chambers and valves are working. This procedure takes approximately one hour. There are no restrictions for this procedure. Please do NOT wear cologne, perfume, aftershave, or lotions (deodorant is allowed). Please arrive 15 minutes prior to your appointment time.  Follow-Up: At [facility], you and your health needs are our priority.  As part of our continuing mission to provide you with exceptional heart care, we have created designated Provider Care Teams.  These Care Teams include your primary Cardiologist (physician) and Advanced Practice Providers (APPs -  Physician Assistants and Nurse Practitioners) who all work together to provide you with the care you need, when you need it.  We recommend signing up for the patient portal called "MyChart".  Sign up information is provided on this After Visit Summary.  MyChart is used to connect with patients for Virtual Visits (Telemedicine).  Patients are able to view lab/test results, encounter notes, upcoming appointments, etc.  Non-urgent messages can be sent to your provider as well.   To learn more about what you can do with MyChart, go to [email or web address].    Your next appointment:   2 month(s)  Provider:   Shelva Majestic, MD        Signed, Shelva Majestic, MD  09/28/2022 2:13 PM    [facility] [address] [place], [place], [place]  [address] Phone: [phone]

## 2022-10-01 ENCOUNTER — Ambulatory Visit (HOSPITAL_COMMUNITY): Payer: Medicare Other | Attending: Cardiology

## 2022-10-01 DIAGNOSIS — I493 Ventricular premature depolarization: Secondary | ICD-10-CM | POA: Diagnosis not present

## 2022-10-01 DIAGNOSIS — I2510 Atherosclerotic heart disease of native coronary artery without angina pectoris: Secondary | ICD-10-CM | POA: Insufficient documentation

## 2022-10-01 LAB — ECHOCARDIOGRAM COMPLETE
Area-P 1/2: 2.56 cm2
S' Lateral: 3.4 cm

## 2022-10-23 ENCOUNTER — Telehealth: Payer: Self-pay

## 2022-10-23 NOTE — Telephone Encounter (Signed)
Call from Okey Regal, NP with [facility], she did a circulation test on patient and resulted impairment. Ms. Don Aguilar said she thinks this may be a false result because the patient's feet were cold but no swelling, pain or discoloration. Ms. Don Aguilar said she just wanted to report the results to you. Thank you.

## 2022-10-30 ENCOUNTER — Ambulatory Visit (INDEPENDENT_AMBULATORY_CARE_PROVIDER_SITE_OTHER): Payer: Medicare Other | Admitting: Family Medicine

## 2022-10-30 ENCOUNTER — Encounter: Payer: Self-pay | Admitting: Family Medicine

## 2022-10-30 VITALS — BP 112/62 | HR 57 | Temp 97.9°F | Ht 69.0 in | Wt 222.0 lb

## 2022-10-30 DIAGNOSIS — R0989 Other specified symptoms and signs involving the circulatory and respiratory systems: Secondary | ICD-10-CM | POA: Diagnosis not present

## 2022-10-30 NOTE — Progress Notes (Signed)
Subjective:    Patient ID: Don Aguilar, male    DOB: [birth year], 72 y.o.   MRN: [id number]  HPI Patient is a very pleasant 72 year old African-American gentleman who has a history of coronary artery disease.  This was treated with CABG.  He has been following up regularly with his cardiologist and he is on maximum medical therapy for cardiovascular disease.  This includes an aspirin as well as a high-dose statin.  His blood pressure is excellent at 112/62.  Recently on a housecalls via his insurance, he was found to have diminished pulses in his right foot.  His ABI was calculated to be less than 0.3!  Patient denies any claudication.  Today on exam, his right foot is warm.  He does have diminished dorsalis pedis pulse.  Is difficult to palpate.  He has a normal capillary refill in his toes.  He has a good strong popliteal pulse.  He also has a good strong femoral pulse.  There is no hair loss on his leg.  Again he denies significant claudication with ambulation Past Medical History:  Diagnosis Date   Coronary artery disease    GERD (gastroesophageal reflux disease)    Hyperlipidemia    Hypertension 2010   Past Surgical History:  Procedure Laterality Date   CARDIAC CATHETERIZATION  06/08/2009   No intervention - recommend CABG   CARDIOVASCULAR STRESS TEST  05/30/2012   Small area of possible ischemia in basal septal wall. ECG negative for ischemia, but frequent PVCs continued from mid to peak exercise with  Bigeminy pattern.   CORONARY ARTERY BYPASS GRAFT  06/10/2009   x4. LIMA to LAD, SVG to first diag, SVG to circumflex, SVG to RCA. Endoscopic vein harvest from right thigh and right lower leg.   HAND TENDON SURGERY  left hand, 1972   TEE WITHOUT CARDIOVERSION  06/10/2009   EF 60-65%, Normal-trace findings   VASCULAR EVALUATION  06/09/2009   No significant extracranial carotid artery stenosis demonstrated. Vertebrals are patent with antegrade flow.   Current Outpatient Medications on  File Prior to Visit  Medication Sig Dispense Refill   aspirin 81 MG tablet Take 81 mg by mouth daily.     hydrochlorothiazide (HYDRODIURIL) 25 MG tablet TAKE 1 TABLET BY MOUTH ONCE DAILY. PT NEEDS OFFICE VISIT FOR FURTHER REFILLS. 90 tablet 1   metoprolol tartrate (LOPRESSOR) 100 MG tablet Take 1 tablet (100 mg total) by mouth 2 (two) times daily. 180 tablet 3   omeprazole (PRILOSEC) 20 MG capsule Take 1 capsule by mouth twice daily 60 capsule 2   ramipril (ALTACE) 10 MG capsule Take 1 capsule by mouth once daily 90 capsule 0   rosuvastatin (CRESTOR) 40 MG tablet TAKE 1 TABLET BY MOUTH ONCE DAILY . APPOINTMENT REQUIRED FOR FUTURE REFILLS 90 tablet 0   No current facility-administered medications on file prior to visit.   Allergies  Allergen Reactions   Niaspan [Niacin Er] Itching    Lower legs   Social History   Socioeconomic History   Marital status: Married    Spouse name: Not on file   Number of children: Not on file   Years of education: Not on file   Highest education level: Not on file  Occupational History   Not on file  Tobacco Use   Smoking status: Former    Packs/day: 2.00    Years: 13.00    Total pack years: 26.00    Types: Cigarettes    Quit date: 03/15/1976  Years since quitting: 46.6   Smokeless tobacco: Never  Substance and Sexual Activity   Alcohol use: No    Alcohol/week: 0.0 standard drinks of alcohol   Drug use: No   Sexual activity: Not on file  Other Topics Concern   Not on file  Social History Narrative   Not on file   Social Determinants of Health   Financial Resource Strain: Not on file  Food Insecurity: Not on file  Transportation Needs: Not on file  Physical Activity: Not on file  Stress: Not on file  Social Connections: Not on file  Intimate Partner Violence: Not on file      Review of Systems  All other systems reviewed and are negative.      Objective:   Physical Exam Vitals reviewed.  Constitutional:      Appearance:  Normal appearance.  Cardiovascular:     Rate and Rhythm: Normal rate and regular rhythm.     Pulses:          Femoral pulses are 2+ on the right side and 2+ on the left side.      Popliteal pulses are 2+ on the right side and 2+ on the left side.       Dorsalis pedis pulses are 0 on the right side and 2+ on the left side.       Posterior tibial pulses are 1+ on the right side and 2+ on the left side.     Heart sounds: Normal heart sounds. No murmur heard.    No friction rub. No gallop.  Pulmonary:     Effort: Pulmonary effort is normal. No respiratory distress.     Breath sounds: Normal breath sounds. No stridor.  Abdominal:     General: Bowel sounds are normal. There is no distension.     Palpations: Abdomen is soft.     Tenderness: There is no abdominal tenderness. There is no guarding or rebound.  Musculoskeletal:     Right lower leg: No edema.     Left lower leg: No edema.  Feet:     Right foot:     Skin integrity: Skin integrity normal. No ulcer.     Left foot:     Skin integrity: Skin integrity normal.  Neurological:     Mental Status: He is alert.           Assessment & Plan:  Decreased pedal pulses - Plan: VAS [place] ABI WITH/WO TBI Patient is asymptomatic.  However I am having a difficult time palpating his dorsalis pedis pulse.  Therefore monitor arrange for the patient to have lower extremity arterial Dopplers with ABI and TBI.  If there is evidence of severe peripheral vascular disease I will refer the patient to vascular surgery.  However, the patient is completely asymptomatic.  If there is mild disease, would recommend continued medical therapy .

## 2022-11-01 ENCOUNTER — Ambulatory Visit (HOSPITAL_COMMUNITY)
Admission: RE | Admit: 2022-11-01 | Discharge: 2022-11-01 | Disposition: A | Discharge status: Home / Self Care | Payer: Medicare Other | Source: Ambulatory Visit | Attending: Vascular Surgery | Admitting: Vascular Surgery

## 2022-11-01 DIAGNOSIS — R0989 Other specified symptoms and signs involving the circulatory and respiratory systems: Secondary | ICD-10-CM | POA: Insufficient documentation

## 2022-11-01 LAB — VAS US ABI WITH/WO TBI
Left ABI: 1.27
Right ABI: 0.84

## 2022-11-05 ENCOUNTER — Other Ambulatory Visit: Payer: Self-pay | Admitting: Family Medicine

## 2022-12-03 ENCOUNTER — Ambulatory Visit: Payer: Medicare Other | Attending: Cardiovascular Disease | Admitting: Cardiovascular Disease

## 2022-12-03 ENCOUNTER — Encounter: Payer: Self-pay | Admitting: Cardiovascular Disease

## 2022-12-03 VITALS — BP 130/70 | HR 58 | Ht 69.0 in | Wt 220.2 lb

## 2022-12-03 DIAGNOSIS — K219 Gastro-esophageal reflux disease without esophagitis: Secondary | ICD-10-CM | POA: Diagnosis not present

## 2022-12-03 DIAGNOSIS — I493 Ventricular premature depolarization: Secondary | ICD-10-CM

## 2022-12-03 DIAGNOSIS — I071 Rheumatic tricuspid insufficiency: Secondary | ICD-10-CM

## 2022-12-03 DIAGNOSIS — Z951 Presence of aortocoronary bypass graft: Secondary | ICD-10-CM | POA: Diagnosis not present

## 2022-12-03 DIAGNOSIS — I340 Nonrheumatic mitral (valve) insufficiency: Secondary | ICD-10-CM | POA: Diagnosis not present

## 2022-12-03 DIAGNOSIS — E785 Hyperlipidemia, unspecified: Secondary | ICD-10-CM | POA: Diagnosis not present

## 2022-12-03 DIAGNOSIS — I2510 Atherosclerotic heart disease of native coronary artery without angina pectoris: Secondary | ICD-10-CM | POA: Diagnosis not present

## 2022-12-03 DIAGNOSIS — E669 Obesity, unspecified: Secondary | ICD-10-CM

## 2022-12-03 NOTE — Progress Notes (Signed)
Cardiology Office Note    Date:  12/08/2022   ID:  Don Aguilar, DOB May 09, 1951, MRN [id number]  PCP:  Donita Brooks, MD  Cardiologist:  Nicki Guadalajara, MD   26-month follow-up evaluation  History of Present Illness:  Don Aguilar is a 72 y.o. male who I evaluated in a telemedicine visit in April 2020.  I had not seen him in almost 4 years and saw him in follow-up on September 28, 2022.  He presents for 24-month follow-up evaluation.  Don Aguilar was found to have severe multivessel CAD in October 2010 and underwent CABG revascularization surgery with Dr. Zenaida Niece trite with a LIMA to LAD, SVG to first diagonal, SVG to circumflex marginal, and SVG to distal RCA.  Patient problems included hypertension, obesity, mixed hyperlipidemia.  In 2013 he was found to have mild obstructive sleep apnea with an AHI of 8.5/h, RDI was 25.4/h and during REM sleep AHI was 10.8/h.  He underwent CPAP titration study and 11 cm water pressure was recommended but he never followed up with CPAP therapy.  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  He had issues of palpitations and shortness of breath with walking up steps.  At that time he was on metoprolol tartrate 150 mg twice a day which controlled his palpitations.  Apparently when he went for renewal he was inadvertently only given 25 mg.  His follow-up, I recommended further titration to twice a day and he noted resolution of his prior palpitations.  An echo Doppler study on January 31, 2017 showed EF 60 to 65% with normal diastolic function.  Atrial septal thickness was increased consistent with lipomatous hypertrophy.  I have not seen him in the office since his April 09, 2017 evaluation.  He was evaluated virtually on November 28, 2018 for the COVID pandemic.  At that time he felt he was sleeping well.  Palpitations were controlled.  He was evaluated by Micah Flesher PA in a virtual visit on September 02, 2020.  At that time he  was noticing some more palpitations.  A Zio patch monitor was ordered and his predominant rhythm was sinus with an average rate at 69 bpm minimum heart rate of 51.  He had 3 episodes of nonsustained VT.  The episode with the fastest and longest interval lasted 8 beats with an average rate of 176.  There were rare isolated PACs and atrial couplets.  He had frequent PVCs with rare couplets and triplets.  There were episodes of ventricular bigeminy and trigeminy.  There were no episodes of atrial fibrillation or pauses.  Apparently there were multiple attempts at contacting him following this evaluation with a message to call back as well as placing a letter in the mail.  Apparently there was no follow-up.  When I saw him on September 28, 2022 he was continuing to be followed by Dr. Tanya Nones at [facility]. He denied any chest pain or shortness of breath.  At times he notes some irregular rhythm but essentially does not pay attention to it.  He believes he is sleeping fairly well typically going to bed around 1030 and waking up at 630.  Has had some mild weight gain.  He is not as active.  He is fully retired.  He underwent laboratory Dr. Tanya Nones on June 15, 2022 which showed total cholesterol 97 HDL 28 LDL 44 triglycerides 169 on rosuvastatin 40 mg.  Hemoglobin A1c was 6.2.  12/22/2021 creatinine was 1.18.  Potassium 3.9.  Currently he is on metoprolol tartrate just at 75 mg twice a day and takes HCTZ 25 mg as needed for fluid and ramipril 10 mg daily for blood pressure control.  He also takes omeprazole and is on baby aspirin.  He denies any recurrent chest pain.  He denies presyncope or syncope.  He denies significant leg edema.  During that evaluation, I reviewed data over the 4 years prior to my evaluation.  His blood pressure was stable on a regimen of chlorothiazide which he takes as needed, ramipril 10 mg in addition to metoprolol tartrate 75 mg twice a day.  His ECG showed ventricular  bigeminal rhythm with an average rate at 69 bpm with T wave abnormality inferiorly and anterolaterally which were old.  I recommended further titration of metoprolol to 100 mg twice a day.  I scheduled him for a follow-up echo Doppler assessment.  He continues to be on rosuvastatin 40 mg for hyperlipidemia with most recent LDL at 44.  He believes he was sleeping well.  Since his last evaluation, he underwent an echo Doppler study on October 01, 2022.  He had normal LV function with EF 60 to 65% without wall motion abnormality.  There was normal RV function and normal pulmonary artery systolic pressure.  There was mild MR, moderate TR.  He subsequently was referred by Dr. Tanya Nones for a lower extremity Doppler study which was done on November 01, 2022 which showed mild resting right ankle-brachial index abnormality at 0.84 with normal right toe brachial index.  Left ABI was normal.  Presently, Don Aguilar feels well.  He denies any chest pain or shortness of breath.  He denies any significant claudication symptoms.  He continues to be on hydrochlorothiazide 25 mg, metoprolol tartrate 100 mg twice a day and ramipril 10 mg for hypertension.  He is on rosuvastatin 40 mg for hyperlipidemia and omeprazole 20 mg twice daily for GERD.  He takes a baby aspirin.  He presents for follow-up evaluation.    Past Medical History:  Diagnosis Date   Coronary artery disease    GERD (gastroesophageal reflux disease)    Hyperlipidemia    Hypertension 2010    Past Surgical History:  Procedure Laterality Date   CARDIAC CATHETERIZATION  06/08/2009   No intervention - recommend CABG   CARDIOVASCULAR STRESS TEST  05/30/2012   Small area of possible ischemia in basal septal wall. ECG negative for ischemia, but frequent PVCs continued from mid to peak exercise with  Bigeminy pattern.   CORONARY ARTERY BYPASS GRAFT  06/10/2009   x4. LIMA to LAD, SVG to first diag, SVG to circumflex, SVG to RCA. Endoscopic vein harvest from right  thigh and right lower leg.   HAND TENDON SURGERY  left hand, 1972   TEE WITHOUT CARDIOVERSION  06/10/2009   EF 60-65%, Normal-trace findings   VASCULAR EVALUATION  06/09/2009   No significant extracranial carotid artery stenosis demonstrated. Vertebrals are patent with antegrade flow.    Current Medications: Outpatient Medications Prior to Visit  Medication Sig Dispense Refill   aspirin 81 MG tablet Take 81 mg by mouth daily.     hydrochlorothiazide (HYDRODIURIL) 25 MG tablet TAKE 1 TABLET BY MOUTH ONCE DAILY. PT NEEDS OFFICE VISIT FOR FURTHER REFILLS. 90 tablet 1   metoprolol tartrate (LOPRESSOR) 100 MG tablet Take 1 tablet (100 mg total) by mouth 2 (two) times daily. 180 tablet 3   omeprazole (PRILOSEC)  20 MG capsule Take 1 capsule by mouth twice daily 60 capsule 2   ramipril (ALTACE) 10 MG capsule Take 1 capsule by mouth once daily 90 capsule 0   rosuvastatin (CRESTOR) 40 MG tablet TAKE 1 TABLET BY MOUTH ONCE DAILY . APPOINTMENT REQUIRED FOR FUTURE REFILLS 90 tablet 0   No facility-administered medications prior to visit.     Allergies:   Niaspan [niacin er]   Social History   Socioeconomic History   Marital status: Married    Spouse name: Not on file   Number of children: Not on file   Years of education: Not on file   Highest education level: Not on file  Occupational History   Not on file  Tobacco Use   Smoking status: Former    Packs/day: 2.00    Years: 13.00    Additional pack years: 0.00    Total pack years: 26.00    Types: Cigarettes    Quit date: 03/15/1976    Years since quitting: 46.7   Smokeless tobacco: Never  Substance and Sexual Activity   Alcohol use: No    Alcohol/week: 0.0 standard drinks of alcohol   Drug use: No   Sexual activity: Not on file  Other Topics Concern   Not on file  Social History Narrative   Not on file   Social Determinants of Health   Financial Resource Strain: Not on file  Food Insecurity: Not on file  Transportation  Needs: Not on file  Physical Activity: Not on file  Stress: Not on file  Social Connections: Not on file    Also history notable that he is married and 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  He has 3 children and several grandchildren.  Family History:  The patient's family history includes Cancer in his sister; Heart failure in his father and mother.   ROS General: Negative; No fevers, chills, or night sweats;  HEENT: Negative; No changes in vision or hearing, sinus congestion, difficulty swallowing Pulmonary: Negative; No cough, wheezing, shortness of breath, hemoptysis Cardiovascular: Negative; No chest pain, presyncope, syncope, palpitations GI: GERD GU: Negative; No dysuria, hematuria, or difficulty voiding Musculoskeletal: Negative; no myalgias, joint pain, or weakness Hematologic/Oncology: Negative; no easy bruising, bleeding Endocrine: Negative; no heat/cold intolerance; no diabetes Neuro: Negative; no changes in balance, headaches Skin: Negative; No rashes or skin lesions Psychiatric: Negative; No behavioral problems, depression Sleep: Negative; No snoring, daytime sleepiness, hypersomnolence, bruxism, restless legs, hypnogognic hallucinations, no cataplexy Other comprehensive 14 point system review is negative.   PHYSICAL EXAM:   VS:  BP 130/70   Pulse (!) 58   Ht 5\' 9"  (1.753 m)   Wt 220 lb 3.2 oz (99.9 kg)   SpO2 98%   BMI 32.52 kg/m    Repeat blood pressure by me was 112/70   Wt Readings from Last 3 Encounters:  12/03/22 220 lb 3.2 oz (99.9 kg)  10/30/22 222 lb (100.7 kg)  09/28/22 224 lb 3.2 oz (101.7 kg)    General: Alert, oriented, no distress.  Skin: normal turgor, no rashes, warm and dry HEENT: Normocephalic, atraumatic. Pupils equal round and reactive to light; sclera anicteric; extraocular muscles intact;  Nose without nasal septal hypertrophy Mouth/Parynx benign; Mallinpatti scale 3 Neck: No JVD, no carotid bruits; normal  carotid upstroke Lungs: clear to ausculatation and percussion; no wheezing or rales Chest wall: without tenderness to palpitation Heart: PMI not displaced, RRR, s1 s2 normal, no ectopy; 1/6 systolic murmur, no diastolic murmur, no  rubs, gallops, thrills, or heaves Abdomen: soft, nontender; no hepatosplenomehaly, BS+; abdominal aorta nontender and not dilated by palpation. Back: no CVA tenderness Pulses 2+ Musculoskeletal: full range of motion, normal strength, no joint deformities Extremities: no clubbing cyanosis or edema, Homan's sign negative  Neurologic: grossly nonfocal; Cranial nerves grossly wnl Psychologic: Normal mood and affect   Studies/Labs Reviewed:   September 28, 2022 ECG (independently read by me): Sinus rhythm at 69; PVC with ventricular bigeminy, inferior and anterolateral T wave abnormality  April 09, 2017 ECG (independently read by me): Rhythm at 60 bpm with anterolateral T wave changes.  Recent Labs:    Latest Ref Rng & Units 08/23/2022   11:24 AM 06/15/2022    8:23 AM 11/28/2021    3:39 PM  BMP  Glucose 65 - 99 mg/dL 191142  95  [id number]   BUN 7 - 25 mg/dL 16  17  21    Creatinine 0.70 - 1.28 mg/dL 2.951.18  6.211.13  3.081.21   BUN/Creat Ratio 6 - 22 (calc) SEE NOTE:  SEE NOTE:  NOT APPLICABLE   Sodium 135 - 146 mmol/L 138  139  138   Potassium 3.5 - 5.3 mmol/L 3.9  4.2  3.9   Chloride 98 - 110 mmol/L 103  102  105   CO2 20 - 32 mmol/L 27  27  23    Calcium 8.6 - 10.3 mg/dL 8.7  9.2  9.5         Latest Ref Rng & Units 06/15/2022    8:23 AM 11/28/2021    3:39 PM 03/26/2019   12:43 PM  Hepatic Function  Total Protein 6.1 - 8.1 g/dL 6.6  6.7  6.6   AST 10 - 35 U/L 22  27  23    ALT 9 - 46 U/L 22  21  21    Total Bilirubin 0.2 - 1.2 mg/dL 0.4  0.5  0.4        Latest Ref Rng & Units 08/23/2022   11:24 AM 06/15/2022    8:23 AM 11/28/2021    3:39 PM  CBC  WBC 3.8 - 10.8 Thousand/uL 6.6  7.7  7.6   Hemoglobin 13.2 - 17.1 g/dL 65.714.5  84.614.5  96.214.7   Hematocrit 38.5 - 50.0 % 42.4   45.1  43.1   Platelets 140 - 400 Thousand/uL 154  147  154    Lab Results  Component Value Date   MCV 89.6 08/23/2022   MCV 90.7 06/15/2022   MCV 90.0 11/28/2021   Lab Results  Component Value Date   TSH 2.76 01/30/2017   Lab Results  Component Value Date   HGBA1C 6.2 (H) 06/15/2022     BNP No results found for: "BNP"  ProBNP No results found for: "PROBNP"   Lipid Panel     Component Value Date/Time   CHOL 97 06/15/2022 0823   CHOL 88 (L) 04/25/2017 0832   TRIG 169 (H) 06/15/2022 0823   HDL 28 (L) 06/15/2022 0823   HDL 31 (L) 04/25/2017 0832   CHOLHDL 3.5 06/15/2022 0823   VLDL 47 (H) 01/30/2017 0930   LDLCALC 44 06/15/2022 0823   LABVLDL 20 04/25/2017 0832     RADIOLOGY: No results found.   Additional studies/ records that were reviewed today include:  Reviewed the patient's prior records.  Telemedicine visit from Micah FlesherAngela Duke were reviewed was reviewed as well as recent evaluation by Dr. Lynnea FerrierWarren Pickard from June 22, 2022.  ASSESSMENT:    1. CAD in native artery  2. S/P CABG (coronary artery bypass graft)   3. Hyperlipidemia with target LDL less than 70   4. Mild mitral regurgitation   5. Moderate tricuspid regurgitation   6. Frequent PVCs; resolved   7. Obesity (BMI 30.0-34.9)   8. Gastroesophageal reflux disease without esophagitis     PLAN:  Mr. Don Aguilar OSA 72 year old African-American male who is status post CABG revascularization surgery in October 2010 with a LIMA to LAD, SVG to first diagonal, SVG to circumflex and SVG to distal RCA.  He has had a history of palpitations and remotely had been controlled with metoprolol tartrate previously up to 150 twice a day.  However, over the past appears he has just been on 75 mg twice a day.  Echo Doppler study in June 2018 showed EF 60 to 65% with moderate LVH without significant valvular pathology.  His blood pressure today is controlled.  At his office visit in February 2024 with me after having  not evaluated him in approximately 4 years, he was in ventricular bigeminy.  At that time metoprolol was increased to 100 mg twice a day.  He has felt well and has noticed resolution of any palpitations.  I reviewed his most recent echo Doppler study from October 01, 2022 which showed normal LV systolic function.  He did not have any wall motion abnormalities.  He had normal RV function with normal pulmonary pressures.  There was mild MR, moderate TR.  His blood pressure today is excellent and on repeat by me was 112/70 on his regimen now consisting of metoprolol tartrate 100 mg twice a day, ramipril 10 mg daily, in addition to HCTZ 25 mg.  He is also on rosuvastatin 40 mg.  LDL cholesterol in October 2023 was excellent at 44.  He had undergone lower extremity Doppler studies ordered by Dr. Tanya Nones.  He does have mild reduction in ABI in the right at 0.84 with normal toe brachial index.  The left ABI was normal and there was abnormality to the left toe brachial index.  He denies any significant claudication symptoms and is followed for this by Dr. Tanya Nones.  Presently, he remains cardiac stable and denies any recurrent chest pain or shortness of breath.  He will follow-up with Dr. Tanya Nones.  As long as he remains stable I will see him in 1 year for reevaluation.    Medication Adjustments/Labs and Tests Ordered: Current medicines are reviewed at length with the patient today.  Concerns regarding medicines are outlined above.  Medication changes, Labs and Tests ordered today are listed in the Patient Instructions below. Patient Instructions  Medication Instructions:  Your physician recommends that you continue on your current medications as directed. Please refer to the Current Medication list given to you today.  *If you need a refill on your cardiac medications before your next appointment, please call your pharmacy*  Follow-Up: At [facility], you and your health needs are our priority.  As  part of our continuing mission to provide you with exceptional heart care, we have created designated Provider Care Teams.  These Care Teams include your primary Cardiologist (physician) and Advanced Practice Providers (APPs -  Physician Assistants and Nurse Practitioners) who all work together to provide you with the care you need, when you need it.  We recommend signing up for the patient portal called "MyChart".  Sign up information is provided on this After Visit Summary.  MyChart is used to connect with patients for Virtual Visits (Telemedicine).  Patients are able to view lab/test results, encounter notes, upcoming appointments, etc.  Non-urgent messages can be sent to your provider as well.   To learn more about what you can do with MyChart, go to [email or web address].    Your next appointment:   12 month(s)  Provider:   Nicki Guadalajarahomas Sevastian Witczak, MD        Signed, Nicki Guadalajarahomas Florian Chauca, MD  12/08/2022 11:34 AM    [facility] [address], [address], [place], [place]  [id number] Phone: [phone]

## 2022-12-03 NOTE — Patient Instructions (Signed)
Medication Instructions:  Your physician recommends that you continue on your current medications as directed. Please refer to the Current Medication list given to you today.  *If you need a refill on your cardiac medications before your next appointment, please call your pharmacy*  Follow-Up: At [facility], you and your health needs are our priority.  As part of our continuing mission to provide you with exceptional heart care, we have created designated Provider Care Teams.  These Care Teams include your primary Cardiologist (physician) and Advanced Practice Providers (APPs -  Physician Assistants and Nurse Practitioners) who all work together to provide you with the care you need, when you need it.  We recommend signing up for the patient portal called "MyChart".  Sign up information is provided on this After Visit Summary.  MyChart is used to connect with patients for Virtual Visits (Telemedicine).  Patients are able to view lab/test results, encounter notes, upcoming appointments, etc.  Non-urgent messages can be sent to your provider as well.   To learn more about what you can do with MyChart, go to [email or web address].    Your next appointment:   12 month(s)  Provider:   Thomas Kelly, MD      

## 2022-12-08 ENCOUNTER — Encounter: Payer: Self-pay | Admitting: Cardiovascular Disease

## 2022-12-13 ENCOUNTER — Telehealth: Payer: Self-pay

## 2022-12-13 ENCOUNTER — Other Ambulatory Visit: Payer: Self-pay

## 2022-12-13 ENCOUNTER — Other Ambulatory Visit: Payer: Self-pay | Admitting: Family Medicine

## 2022-12-13 NOTE — Telephone Encounter (Signed)
Prescription Request  12/13/2022  LOV: 09/18/22  What is the name of the medication or equipment? ramipril (ALTACE) 10 MG capsule [[id number]]  Have you contacted your pharmacy to request a refill? Yes   Which pharmacy would you like this sent to?  [id number] - [place] ([place]), [place] - [address] [place] ([place]) [place] [address] Phone: [phone] Fax: [phone]    Patient notified that their request is being sent to the clinical staff for review and that they should receive a response within 2 business days.   Please advise at [facility] [phone]

## 2023-02-02 ENCOUNTER — Other Ambulatory Visit: Payer: Self-pay | Admitting: Family Medicine

## 2023-02-04 NOTE — Telephone Encounter (Signed)
Requested Prescriptions  Pending Prescriptions Disp Refills   rosuvastatin (CRESTOR) 40 MG tablet [Pharmacy Med Name: Rosuvastatin Calcium 40 MG Oral Tablet] 90 tablet 0    Sig: TAKE 1 TABLET BY MOUTH ONCE DAILY . APPOINTMENT REQUIRED FOR FUTURE REFILLS     Cardiovascular:  Antilipid - Statins 2 Failed - 02/04/2023 11:50 AM      Failed - Valid encounter within last 12 months    Recent Outpatient Visits           1 year ago Benign prostatic hyperplasia with incomplete bladder emptying   [facility] Donita Brooks, MD   1 year ago Hyperlipidemia with target LDL less than [address] Family Medicine [place], Priscille Heidelberg, MD   2 years ago Acute bronchitis, unspecified organism   [facility] [place], Velna Hatchet, MD   2 years ago Acute bronchitis, unspecified organism   [facility] Elmore Guise, FNP   3 years ago Tinnitus aurium, bilateral   [facility] Medicine [place], Priscille Heidelberg, MD              Failed - Lipid Panel in normal range within the last 12 months    Cholesterol, Total  Date Value Ref Range Status  04/25/2017 88 (L) 100 - 199 mg/dL Final   Cholesterol  Date Value Ref Range Status  06/15/2022 97 <200 mg/dL Final   LDL Cholesterol (Calc)  Date Value Ref Range Status  06/15/2022 44 mg/dL (calc) Final    Comment:    Reference range: <100 . Desirable range <100 mg/dL for primary prevention;   <70 mg/dL for patients with CHD or diabetic patients  with > or = 2 CHD risk factors. Marland Kitchen LDL-C is now calculated using the Martin-Hopkins  calculation, which is a validated novel method providing  better accuracy than the Friedewald equation in the  estimation of LDL-C.  Horald Pollen et al. Lenox Ahr. 1610;960(45): 2061-2068  (http://education.[facility].com/faq/FAQ164)    HDL  Date Value Ref Range Status  06/15/2022 28 (L) > OR = 40 mg/dL Final  40/98/1191 31 (L) >39 mg/dL Final   Triglycerides  Date Value  Ref Range Status  06/15/2022 169 (H) <150 mg/dL Final         Passed - Cr in normal range and within 360 days    Creat  Date Value Ref Range Status  08/23/2022 1.18 0.70 - 1.28 mg/dL Final         Passed - Patient is not pregnant

## 2023-02-11 ENCOUNTER — Other Ambulatory Visit: Payer: Self-pay | Admitting: Family Medicine

## 2023-02-27 ENCOUNTER — Other Ambulatory Visit: Payer: Self-pay | Admitting: Family Medicine

## 2023-03-13 ENCOUNTER — Other Ambulatory Visit: Payer: Self-pay | Admitting: Family Medicine

## 2023-03-14 NOTE — Telephone Encounter (Signed)
Requested Prescriptions  Pending Prescriptions Disp Refills   ramipril (ALTACE) 10 MG capsule [Pharmacy Med Name: Ramipril 10 MG Oral Capsule] 90 capsule 0    Sig: Take 1 capsule by mouth once daily     Cardiovascular:  ACE Inhibitors Failed - 03/13/2023  4:50 PM      Failed - Cr in normal range and within 180 days    Creat  Date Value Ref Range Status  08/23/2022 1.18 0.70 - 1.28 mg/dL Final         Failed - K in normal range and within 180 days    Potassium  Date Value Ref Range Status  08/23/2022 3.9 3.5 - 5.3 mmol/L Final         Failed - Valid encounter within last 6 months    Recent Outpatient Visits           1 year ago Benign prostatic hyperplasia with incomplete bladder emptying   [facility] Donita Brooks, MD   1 year ago Hyperlipidemia with target LDL less than 70   [facility] Pickard, Priscille Heidelberg, MD   3 years ago Acute bronchitis, unspecified organism   [facility] Medicine [place], Velna Hatchet, MD   3 years ago Acute bronchitis, unspecified organism   [facility] Medicine Jenne Pane, Rocky Crafts, FNP   3 years ago Tinnitus aurium, bilateral   [facility] [place], Priscille Heidelberg, MD              Passed - Patient is not pregnant      Passed - Last BP in normal range    BP Readings from Last 1 Encounters:  12/03/22 130/70

## 2023-05-07 ENCOUNTER — Other Ambulatory Visit: Payer: Self-pay | Admitting: Family Medicine

## 2023-05-08 NOTE — Telephone Encounter (Signed)
Requested Prescriptions  Pending Prescriptions Disp Refills   rosuvastatin (CRESTOR) 40 MG tablet [Pharmacy Med Name: Rosuvastatin Calcium 40 MG Oral Tablet] 90 tablet 0    Sig: TAKE 1 TABLET BY MOUTH ONCE DAILY . APPOINTMENT REQUIRED FOR FUTURE REFILLS     Cardiovascular:  Antilipid - Statins 2 Failed - 05/07/2023 11:34 AM      Failed - Valid encounter within last 12 months    Recent Outpatient Visits           1 year ago Benign prostatic hyperplasia with incomplete bladder emptying   [facility] Donita Brooks, MD   1 year ago Hyperlipidemia with target LDL less than [address] Family Medicine [place], Priscille Heidelberg, MD   3 years ago Acute bronchitis, unspecified organism   [facility] Medicine [place], Velna Hatchet, MD   3 years ago Acute bronchitis, unspecified organism   [facility] Medicine Elmore Guise, FNP   3 years ago Tinnitus aurium, bilateral   [facility] Medicine [place], Priscille Heidelberg, MD              Failed - Lipid Panel in normal range within the last 12 months    Cholesterol, Total  Date Value Ref Range Status  04/25/2017 88 (L) 100 - 199 mg/dL Final   Cholesterol  Date Value Ref Range Status  06/15/2022 97 <200 mg/dL Final   LDL Cholesterol (Calc)  Date Value Ref Range Status  06/15/2022 44 mg/dL (calc) Final    Comment:    Reference range: <100 . Desirable range <100 mg/dL for primary prevention;   <70 mg/dL for patients with CHD or diabetic patients  with > or = 2 CHD risk factors. Marland Kitchen LDL-C is now calculated using the Martin-Hopkins  calculation, which is a validated novel method providing  better accuracy than the Friedewald equation in the  estimation of LDL-C.  Horald Pollen et al. Lenox Ahr. 6962;952(84): 2061-2068  (http://education.[facility].com/faq/FAQ164)    HDL  Date Value Ref Range Status  06/15/2022 28 (L) > OR = 40 mg/dL Final  13/24/4010 31 (L) >39 mg/dL Final   Triglycerides  Date Value  Ref Range Status  06/15/2022 169 (H) <150 mg/dL Final         Passed - Cr in normal range and within 360 days    Creat  Date Value Ref Range Status  08/23/2022 1.18 0.70 - 1.28 mg/dL Final         Passed - Patient is not pregnant

## 2023-05-20 ENCOUNTER — Other Ambulatory Visit: Payer: Self-pay | Admitting: Family Medicine

## 2023-05-21 NOTE — Telephone Encounter (Signed)
Requested Prescriptions  Pending Prescriptions Disp Refills   omeprazole (PRILOSEC) 20 MG capsule [Pharmacy Med Name: Omeprazole 20 MG Oral Capsule Delayed Release] 60 capsule 0    Sig: Take 1 capsule by mouth twice daily     Gastroenterology: Proton Pump Inhibitors Failed - 05/20/2023 10:13 AM      Failed - Valid encounter within last 12 months    Recent Outpatient Visits           1 year ago Benign prostatic hyperplasia with incomplete bladder emptying   [facility] Donita Brooks, MD   1 year ago Hyperlipidemia with target LDL less than [address] Family Medicine [place], Priscille Heidelberg, MD   3 years ago Acute bronchitis, unspecified organism   [facility] Medicine [place], Velna Hatchet, MD   3 years ago Acute bronchitis, unspecified organism   [facility] Medicine Elmore Guise, FNP   3 years ago Tinnitus aurium, bilateral   [facility] Pickard, Priscille Heidelberg, MD

## 2023-06-13 ENCOUNTER — Other Ambulatory Visit: Payer: Self-pay | Admitting: Family Medicine

## 2023-06-13 NOTE — Telephone Encounter (Signed)
Requested medications are due for refill today.  yes  Requested medications are on the active medications list.  yes  Last refill. 03/14/2023 #90  Future visit scheduled.   no  Notes to clinic.  Pt is due for CPE.    Requested Prescriptions  Pending Prescriptions Disp Refills   ramipril (ALTACE) 10 MG capsule [Pharmacy Med Name: Ramipril 10 MG Oral Capsule] 90 capsule 0    Sig: Take 1 capsule by mouth once daily     Cardiovascular:  ACE Inhibitors Failed - 06/13/2023 11:35 AM      Failed - Cr in normal range and within 180 days    Creat  Date Value Ref Range Status  08/23/2022 1.18 0.70 - 1.28 mg/dL Final         Failed - K in normal range and within 180 days    Potassium  Date Value Ref Range Status  08/23/2022 3.9 3.5 - 5.3 mmol/L Final         Failed - Valid encounter within last 6 months    Recent Outpatient Visits           1 year ago Benign prostatic hyperplasia with incomplete bladder emptying   [facility] Donita Brooks, MD   1 year ago Hyperlipidemia with target LDL less than 70   [facility] Medicine [place], Priscille Heidelberg, MD   3 years ago Acute bronchitis, unspecified organism   [facility] Medicine [place], Velna Hatchet, MD   3 years ago Acute bronchitis, unspecified organism   [facility] Medicine Jenne Pane, Rocky Crafts, FNP   3 years ago Tinnitus aurium, bilateral   [facility] [place], Priscille Heidelberg, MD              Passed - Patient is not pregnant      Passed - Last BP in normal range    BP Readings from Last 1 Encounters:  12/03/22 130/70

## 2023-06-16 ENCOUNTER — Other Ambulatory Visit: Payer: Self-pay | Admitting: Family Medicine

## 2023-06-18 NOTE — Telephone Encounter (Signed)
Requested Prescriptions  Pending Prescriptions Disp Refills   omeprazole (PRILOSEC) 20 MG capsule [Pharmacy Med Name: Omeprazole 20 MG Oral Capsule Delayed Release] 60 capsule 0    Sig: Take 1 capsule by mouth twice daily     Gastroenterology: Proton Pump Inhibitors Failed - 06/16/2023  6:55 PM      Failed - Valid encounter within last 12 months    Recent Outpatient Visits           1 year ago Benign prostatic hyperplasia with incomplete bladder emptying   [facility] Donita Brooks, MD   1 year ago Hyperlipidemia with target LDL less than [address] Family Medicine [place]ard, Priscille Heidelberg, MD   3 years ago Acute bronchitis, unspecified organism   [facility] Medicine [place], Velna Hatchet, MD   3 years ago Acute bronchitis, unspecified organism   [facility] Medicine Elmore Guise, FNP   3 years ago Tinnitus aurium, bilateral   [facility] Medicine [place], Priscille Heidelberg, MD

## 2023-06-21 ENCOUNTER — Telehealth: Payer: Self-pay

## 2023-06-21 MED ORDER — RAMIPRIL 10 MG PO CAPS: 10.0000 mg | ORAL_CAPSULE | Freq: Every day | ORAL | 0 refills | Status: DC

## 2023-06-21 NOTE — Telephone Encounter (Signed)
Pt called in to check on status of this refill ramipril (ALTACE) 10 MG capsule [[id number]]. Pt is scheduled for an OV on 06/27/23 for a medcheck f/u. Pt states that he is completely out of this med. Please advise  LOV: 10/30/22  PHARMACY: [facility] - [place] ([place]), [place] - [address] [place] ([place]) [place] [address] Phone: [phone]  Fax: [phone]  CB#: [phone]

## 2023-06-27 ENCOUNTER — Encounter: Payer: Self-pay | Admitting: Family Medicine

## 2023-06-27 ENCOUNTER — Ambulatory Visit (INDEPENDENT_AMBULATORY_CARE_PROVIDER_SITE_OTHER): Payer: Medicare Other | Admitting: Family Medicine

## 2023-06-27 VITALS — BP 118/80 | HR 68 | Temp 98.0°F | Ht 69.0 in | Wt 217.1 lb

## 2023-06-27 DIAGNOSIS — E785 Hyperlipidemia, unspecified: Secondary | ICD-10-CM

## 2023-06-27 DIAGNOSIS — R7303 Prediabetes: Secondary | ICD-10-CM

## 2023-06-27 DIAGNOSIS — I2510 Atherosclerotic heart disease of native coronary artery without angina pectoris: Secondary | ICD-10-CM

## 2023-06-27 NOTE — Progress Notes (Signed)
Subjective:    Patient ID: Don Aguilar, male    DOB: 16-Feb-1951, 72 y.o.   MRN: [id number]  Diarrhea    Patient is a very pleasant 72 year old African-American gentleman who has a history of coronary artery disease.  This was treated with CABG.  He has been following up regularly with his cardiologist and he is on maximum medical therapy for cardiovascular disease.  This includes an aspirin as well as a high-dose statin.  His blood pressure today is excellent.  He denies any chest pain shortness of breath or dyspnea on exertion.  He recently had his flu shot and his COVID shot.  Over the last 4 days he has had some diarrhea but this is getting better.  He also complains of pain in his right shoulder.  The pain is worse with abduction greater than 90 degrees as well as internal and external rotation.  This has been present for 3 weeks.  He states that it is slowly getting better. Past Medical History:  Diagnosis Date   Coronary artery disease    GERD (gastroesophageal reflux disease)    Hyperlipidemia    Hypertension 2010   Past Surgical History:  Procedure Laterality Date   CARDIAC CATHETERIZATION  06/08/2009   No intervention - recommend CABG   CARDIOVASCULAR STRESS TEST  05/30/2012   Small area of possible ischemia in basal septal wall. ECG negative for ischemia, but frequent PVCs continued from mid to peak exercise with  Bigeminy pattern.   CORONARY ARTERY BYPASS GRAFT  06/10/2009   x4. LIMA to LAD, SVG to first diag, SVG to circumflex, SVG to RCA. Endoscopic vein harvest from right thigh and right lower leg.   HAND TENDON SURGERY  left hand, 1972   TEE WITHOUT CARDIOVERSION  06/10/2009   EF 60-65%, Normal-trace findings   VASCULAR EVALUATION  06/09/2009   No significant extracranial carotid artery stenosis demonstrated. Vertebrals are patent with antegrade flow.   Current Outpatient Medications on File Prior to Visit  Medication Sig Dispense Refill   aspirin 81 MG tablet Take  81 mg by mouth daily.     hydrochlorothiazide (HYDRODIURIL) 25 MG tablet TAKE 1 TABLET BY MOUTH ONCE DAILY . APPOINTMENT REQUIRED FOR FUTURE REFILLS 90 tablet 1   metoprolol tartrate (LOPRESSOR) 100 MG tablet Take 1 tablet (100 mg total) by mouth 2 (two) times daily. 180 tablet 3   omeprazole (PRILOSEC) 20 MG capsule Take 1 capsule by mouth twice daily 60 capsule 0   ramipril (ALTACE) 10 MG capsule Take 1 capsule (10 mg total) by mouth daily. 30 capsule 0   rosuvastatin (CRESTOR) 40 MG tablet TAKE 1 TABLET BY MOUTH ONCE DAILY . APPOINTMENT REQUIRED FOR FUTURE REFILLS 90 tablet 0   No current facility-administered medications on file prior to visit.   Allergies  Allergen Reactions   Niaspan [Niacin Er (Antihyperlipidemic)] Itching    Lower legs   Social History   Socioeconomic History   Marital status: Married    Spouse name: Not on file   Number of children: Not on file   Years of education: Not on file   Highest education level: Not on file  Occupational History   Not on file  Tobacco Use   Smoking status: Former    Current packs/day: 0.00    Average packs/day: 2.0 packs/day for 13.0 years (26.0 ttl pk-yrs)    Types: Cigarettes    Start date: 03/16/1963    Quit date: 03/15/1976    Years since quitting:  47.3   Smokeless tobacco: Never  Substance and Sexual Activity   Alcohol use: No    Alcohol/week: 0.0 standard drinks of alcohol   Drug use: No   Sexual activity: Not on file  Other Topics Concern   Not on file  Social History Narrative   Not on file   Social Determinants of Health   Financial Resource Strain: Patient Declined (06/26/2023)   Overall Financial Resource Strain (CARDIA)    Difficulty of Paying Living Expenses: Patient declined  Food Insecurity: Patient Declined (06/26/2023)   Hunger Vital Sign    Worried About Running Out of Food in the Last Year: Patient declined    Ran Out of Food in the Last Year: Patient declined  Transportation Needs: Patient  Declined (06/26/2023)   PRAPARE - Administrator, Civil Service (Medical): Patient declined    Lack of Transportation (Non-Medical): Patient declined  Physical Activity: Insufficiently Active (06/26/2023)   Exercise Vital Sign    Days of Exercise per Week: 1 day    Minutes of Exercise per Session: 20 min  Stress: Patient Declined (06/26/2023)   [employer] of Occupational Health - Occupational Stress Questionnaire    Feeling of Stress : Patient declined  Social Connections: Unknown (06/26/2023)   Social Connection and Isolation Panel [NHANES]    Frequency of Communication with Friends and Family: Patient declined    Frequency of Social Gatherings with Friends and Family: Once a week    Attends Religious Services: Patient declined    Database administrator or Organizations: Patient declined    Attends Banker Meetings: Not on file    Marital Status: Married  Catering manager Violence: Not on file      Review of Systems  Gastrointestinal:  Positive for diarrhea.  All other systems reviewed and are negative.      Objective:   Physical Exam Vitals reviewed.  Constitutional:      Appearance: Normal appearance.  Cardiovascular:     Rate and Rhythm: Normal rate and regular rhythm.     Heart sounds: Normal heart sounds. No murmur heard.    No friction rub. No gallop.  Pulmonary:     Effort: Pulmonary effort is normal. No respiratory distress.     Breath sounds: Normal breath sounds. No stridor.  Abdominal:     General: Bowel sounds are normal. There is no distension.     Palpations: Abdomen is soft.     Tenderness: There is no abdominal tenderness. There is no guarding or rebound.  Musculoskeletal:     Right lower leg: No edema.     Left lower leg: No edema.  Feet:     Right foot:     Skin integrity: Skin integrity normal. No ulcer.     Left foot:     Skin integrity: Skin integrity normal.  Neurological:     Mental Status: He is alert.            Assessment & Plan:  Coronary artery disease involving native coronary artery of native heart without angina pectoris - Plan: CBC with Differential/Platelet, COMPLETE METABOLIC PANEL WITH GFR, Lipid panel, Hemoglobin A1c  Prediabetes - Plan: CBC with Differential/Platelet, COMPLETE METABOLIC PANEL WITH GFR, Lipid panel, Hemoglobin A1c  Hyperlipidemia with target LDL less than 70 - Plan: CBC with Differential/Platelet, COMPLETE METABOLIC PANEL WITH GFR, Lipid panel, Hemoglobin A1c Patient's blood pressure today is outstanding.  Check CBC CMP lipid panel and A1c.  I like to keep his  LDL cholesterol less than 875 and ideally less than 55.  I like to see his A1c less than 6.5.  Monitor renal function and liver function test.  I believe the patient recently had a viral gastroenteritis and I recommended Imodium over-the-counter to treat the diarrhea.  I believe he also has supraspinatus tendinitis.  We discussed a cortisone injection in the subacromial space but he would like to allow more time for this to improve

## 2023-06-28 LAB — CBC WITH DIFFERENTIAL/PLATELET
Absolute Lymphocytes: 2000 {cells}/uL (ref 850–3900)
Absolute Monocytes: 672 {cells}/uL (ref 200–950)
Basophils Absolute: 29 {cells}/uL (ref 0–200)
Basophils Relative: 0.4 %
Eosinophils Absolute: 131 {cells}/uL (ref 15–500)
Eosinophils Relative: 1.8 %
HCT: 42.1 % (ref 38.5–50.0)
Hemoglobin: 13.6 g/dL (ref 13.2–17.1)
MCH: 29.4 pg (ref 27.0–33.0)
MCHC: 32.3 g/dL (ref 32.0–36.0)
MCV: 90.9 fL (ref 80.0–100.0)
MPV: 12.2 fL (ref 7.5–12.5)
Monocytes Relative: 9.2 %
Neutro Abs: 4468 {cells}/uL (ref 1500–7800)
Neutrophils Relative %: 61.2 %
Platelets: 174 10*3/uL (ref 140–400)
RBC: 4.63 10*6/uL (ref 4.20–5.80)
RDW: 12.4 % (ref 11.0–15.0)
Total Lymphocyte: 27.4 %
WBC: 7.3 10*3/uL (ref 3.8–10.8)

## 2023-06-28 LAB — LIPID PANEL
Cholesterol: 87 mg/dL (ref <200)
HDL: 25 mg/dL — ABNORMAL LOW (ref >=40)
LDL Cholesterol (Calc): 38 mg/dL
Non-HDL Cholesterol (Calc): 62 mg/dL (ref <130)
Total CHOL/HDL Ratio: 3.5 (calc) (ref <5.0)
Triglycerides: 153 mg/dL — ABNORMAL HIGH (ref <150)

## 2023-06-28 LAB — COMPLETE METABOLIC PANEL WITH GFR
AG Ratio: 1.6 (calc) (ref 1.0–2.5)
ALT: 15 U/L (ref 9–46)
AST: 20 U/L (ref 10–35)
Albumin: 3.9 g/dL (ref 3.6–5.1)
Alkaline phosphatase (APISO): 77 U/L (ref 35–144)
BUN: 18 mg/dL (ref 7–25)
CO2: 28 mmol/L (ref 20–32)
Calcium: 8.8 mg/dL (ref 8.6–10.3)
Chloride: 102 mmol/L (ref 98–110)
Creat: 1.22 mg/dL (ref 0.70–1.28)
Globulin: 2.4 g/dL (ref 1.9–3.7)
Glucose, Bld: 113 mg/dL — ABNORMAL HIGH (ref 65–99)
Potassium: 3.7 mmol/L (ref 3.5–5.3)
Sodium: 138 mmol/L (ref 135–146)
Total Bilirubin: 0.6 mg/dL (ref 0.2–1.2)
Total Protein: 6.3 g/dL (ref 6.1–8.1)
eGFR: 63 mL/min/{1.73_m2} (ref >=60)

## 2023-06-28 LAB — HEMOGLOBIN A1C
Hgb A1c MFr Bld: 6.4 %{Hb} — ABNORMAL HIGH (ref <5.7)
Mean Plasma Glucose: 137 mg/dL
eAG (mmol/L): 7.6 mmol/L

## 2023-07-08 ENCOUNTER — Other Ambulatory Visit: Payer: Self-pay | Admitting: Family Medicine

## 2023-07-10 ENCOUNTER — Other Ambulatory Visit: Payer: Self-pay | Admitting: Family Medicine

## 2023-07-12 ENCOUNTER — Telehealth: Payer: Self-pay

## 2023-07-12 ENCOUNTER — Other Ambulatory Visit: Payer: Self-pay | Admitting: Family Medicine

## 2023-07-12 ENCOUNTER — Other Ambulatory Visit: Payer: Self-pay

## 2023-07-12 DIAGNOSIS — E785 Hyperlipidemia, unspecified: Secondary | ICD-10-CM

## 2023-07-12 MED ORDER — ROSUVASTATIN CALCIUM 40 MG PO TABS: 40.0000 mg | ORAL_TABLET | Freq: Every day | ORAL | 0 refills | Status: DC

## 2023-07-12 NOTE — Telephone Encounter (Signed)
Copied from CRM [id number]. Topic: Clinical - Medication Refill >> Jul 12, 2023  4:04 PM Almira Coaster wrote: Most Recent Primary Care Visit:  Provider: Lynnea Ferrier T  Department[facility]  Visit Type: OFFICE VISIT  Date: 06/27/2023  Medication: rosuvastatin (CRESTOR) 40 MG tablet [[id number]]  Has the patient contacted their pharmacy? Yes, they don't have the prescription.  (Agent: If no, request that the patient contact the pharmacy for the refill. If patient does not wish to contact the pharmacy document the reason why and proceed with request.) (Agent: If yes, when and what did the pharmacy advise?)  Is this the correct pharmacy for this prescription? Yes If no, delete pharmacy and type the correct one.  This is the patient's preferred pharmacy:  [facility] - [place] ([place]), [place] - [address] [place] ([place]) [place] [address] Phone: [phone] Fax: [phone]   Has the prescription been filled recently? No  Is the patient out of the medication? Yes  Has the patient been seen for an appointment in the last year OR does the patient have an upcoming appointment? Yes  Can we respond through MyChart? Yes  Agent: Please be advised that Rx refills may take up to 3 business days. We ask that you follow-up with your pharmacy.

## 2023-07-15 NOTE — Telephone Encounter (Signed)
Reordered 07/02/23 #90  Requested Prescriptions  Refused Prescriptions Disp Refills   rosuvastatin (CRESTOR) 40 MG tablet [Pharmacy Med Name: Rosuvastatin Calcium 40 MG Oral Tablet] 90 tablet 0    Sig: TAKE 1 TABLET BY MOUTH ONCE DAILY. APPOINTMENT REQUIRED FOR FUTURE REFILLS.     Cardiovascular:  Antilipid - Statins 2 Failed - 07/12/2023  3:54 PM      Failed - Valid encounter within last 12 months    Recent Outpatient Visits           1 year ago Benign prostatic hyperplasia with incomplete bladder emptying   [facility] Donita Brooks, MD   1 year ago Hyperlipidemia with target LDL less than [address] Family Medicine [place], Priscille Heidelberg, MD   3 years ago Acute bronchitis, unspecified organism   [facility] [place], Velna Hatchet, MD   3 years ago Acute bronchitis, unspecified organism   [facility] Medicine Jenne Pane, Rocky Crafts, FNP   3 years ago Tinnitus aurium, bilateral   [facility] [place], Priscille Heidelberg, MD       Future Appointments             In 5 months Pickard, Priscille Heidelberg, MD [facility]            Failed - Lipid Panel in normal range within the last 12 months    Cholesterol, Total  Date Value Ref Range Status  04/25/2017 88 (L) 100 - 199 mg/dL Final   Cholesterol  Date Value Ref Range Status  06/27/2023 87 <200 mg/dL Final   LDL Cholesterol (Calc)  Date Value Ref Range Status  06/27/2023 38 mg/dL (calc) Final    Comment:    Reference range: <100 . Desirable range <100 mg/dL for primary prevention;   <70 mg/dL for patients with CHD or diabetic patients  with > or = 2 CHD risk factors. Marland Kitchen LDL-C is now calculated using the Martin-Hopkins  calculation, which is a validated novel method providing  better accuracy than the Friedewald equation in the  estimation of LDL-C.  Horald Pollen et al. Lenox Ahr. 3664;403(47): 2061-2068   (http://education.[facility].com/faq/FAQ164)    HDL  Date Value Ref Range Status  06/27/2023 25 (L) > OR = 40 mg/dL Final  42/59/5638 31 (L) >39 mg/dL Final   Triglycerides  Date Value Ref Range Status  06/27/2023 153 (H) <150 mg/dL Final         Passed - Cr in normal range and within 360 days    Creat  Date Value Ref Range Status  06/27/2023 1.22 0.70 - 1.28 mg/dL Final         Passed - Patient is not pregnant

## 2023-07-16 ENCOUNTER — Other Ambulatory Visit: Payer: Self-pay | Admitting: Family Medicine

## 2023-07-18 NOTE — Telephone Encounter (Signed)
Requested Prescriptions  Pending Prescriptions Disp Refills   ramipril (ALTACE) 10 MG capsule [Pharmacy Med Name: Ramipril 10 MG Oral Capsule] 90 capsule 1    Sig: Take 1 capsule by mouth once daily     Cardiovascular:  ACE Inhibitors Failed - 07/16/2023  5:19 PM      Failed - Valid encounter within last 6 months    Recent Outpatient Visits           1 year ago Benign prostatic hyperplasia with incomplete bladder emptying   [facility] Donita Brooks, MD   1 year ago Hyperlipidemia with target LDL less than [address] Family Medicine [place], Priscille Heidelberg, MD   3 years ago Acute bronchitis, unspecified organism   [facility] [place], Velna Hatchet, MD   3 years ago Acute bronchitis, unspecified organism   [facility] Medicine Jenne Pane, [place] A, FNP   3 years ago Tinnitus aurium, bilateral   [facility] [place], Priscille Heidelberg, MD       Future Appointments             In 5 months Pickard, Priscille Heidelberg, MD [facility]            Passed - Cr in normal range and within 180 days    Creat  Date Value Ref Range Status  06/27/2023 1.22 0.70 - 1.28 mg/dL Final         Passed - K in normal range and within 180 days    Potassium  Date Value Ref Range Status  06/27/2023 3.7 3.5 - 5.3 mmol/L Final         Passed - Patient is not pregnant      Passed - Last BP in normal range    BP Readings from Last 1 Encounters:  06/27/23 118/80

## 2023-09-23 ENCOUNTER — Other Ambulatory Visit: Payer: Self-pay | Admitting: Family Medicine

## 2023-10-11 ENCOUNTER — Other Ambulatory Visit: Payer: Self-pay | Admitting: Family Medicine

## 2023-10-11 DIAGNOSIS — E785 Hyperlipidemia, unspecified: Secondary | ICD-10-CM

## 2023-10-11 NOTE — Telephone Encounter (Signed)
Requested by interface surescrips. Last OV 06/27/23 last labs 06/27/23. Future visit in 2 months  Requested Prescriptions  Pending Prescriptions Disp Refills   rosuvastatin (CRESTOR) 40 MG tablet [Pharmacy Med Name: Rosuvastatin Calcium 40 MG Oral Tablet] 90 tablet 0    Sig: Take 1 tablet by mouth once daily     Cardiovascular:  Antilipid - Statins 2 Failed - 10/11/2023 11:30 AM      Failed - Valid encounter within last 12 months    Recent Outpatient Visits           1 year ago Benign prostatic hyperplasia with incomplete bladder emptying   [facility] Donita Brooks, MD   1 year ago Hyperlipidemia with target LDL less than [address] Family [facility] Pickard, Priscille Heidelberg, MD   3 years ago Acute bronchitis, unspecified organism   [facility] [place], Velna Hatchet, MD   3 years ago Acute bronchitis, unspecified organism   [place] [facility] Jenne Pane, Rocky Crafts, FNP   3 years ago Tinnitus aurium, bilateral   [facility] Pickard, Priscille Heidelberg, MD       Future Appointments             In 2 months Pickard, Priscille Heidelberg, MD [facility], P[facility]   In 2 months Lennette Bihari, MD [facility]            Failed - Lipid Panel in normal range within the last 12 months    Cholesterol, Total  Date Value Ref Range Status  04/25/2017 88 (L) 100 - 199 mg/dL Final   Cholesterol  Date Value Ref Range Status  06/27/2023 87 <200 mg/dL Final   LDL Cholesterol (Calc)  Date Value Ref Range Status  06/27/2023 38 mg/dL (calc) Final    Comment:    Reference range: <100 . Desirable range <100 mg/dL for primary prevention;   <70 mg/dL for patients with CHD or diabetic patients  with > or = 2 CHD risk factors. Marland Kitchen LDL-C is now calculated using the Martin-Hopkins  calculation, which is a validated novel method providing  better accuracy than the Friedewald equation in the  estimation of  LDL-C.  Horald Pollen et al. Lenox Ahr. 6295;284(13): 2061-2068  (http://education.[facility].com/faq/FAQ164)    HDL  Date Value Ref Range Status  06/27/2023 25 (L) > OR = 40 mg/dL Final  24/40/1027 31 (L) >39 mg/dL Final   Triglycerides  Date Value Ref Range Status  06/27/2023 153 (H) <150 mg/dL Final         Passed - Cr in normal range and within 360 days    Creat  Date Value Ref Range Status  06/27/2023 1.22 0.70 - 1.28 mg/dL Final         Passed - Patient is not pregnant

## 2023-10-23 ENCOUNTER — Telehealth: Payer: Self-pay

## 2023-10-23 NOTE — Telephone Encounter (Signed)
 Patient dropped off envelope w paperwork that needs to be filled out.  Paperwork is in the mailbox in back   10-23-23 VB

## 2023-10-25 ENCOUNTER — Telehealth: Payer: Self-pay | Admitting: Cardiovascular Disease

## 2023-10-25 NOTE — Telephone Encounter (Signed)
 Patient returned staff call regarding paperwork.  See note dated 2/26.

## 2023-10-25 NOTE — Telephone Encounter (Signed)
 Called patient and left message. Patient does not have the qualifying "diseases" to qualify for the [facility] plan. Waiting for patient to call back to further discuss.

## 2023-10-25 NOTE — Telephone Encounter (Signed)
 Please see documentation in 2/26 telephone encounter

## 2023-10-25 NOTE — Telephone Encounter (Signed)
 Patient submitted forms from [facility] for the doctors completion in order for patient to qualify for [facility] complete care. Submitted the forms as requested by fax. Patient is aware that he did not fully complete due to having no signature. Asked to submit regardless to incomplete patient portion. Used stamp of Dr. Landry Dyke signature to complete clinical portion.

## 2023-11-03 ENCOUNTER — Other Ambulatory Visit: Payer: Self-pay | Admitting: Cardiovascular Disease

## 2023-11-04 ENCOUNTER — Telehealth: Payer: Self-pay | Admitting: Cardiovascular Disease

## 2023-11-04 MED ORDER — METOPROLOL TARTRATE 100 MG PO TABS: 100.0000 mg | ORAL_TABLET | Freq: Two times a day (BID) | ORAL | 0 refills | Status: DC

## 2023-11-04 NOTE — Telephone Encounter (Signed)
 Pt's medication was sent to pt's pharmacy as requested. Confirmation received.

## 2023-11-04 NOTE — Telephone Encounter (Signed)
*  STAT* If patient is at the pharmacy, call can be transferred to refill team.   1. Which medications need to be refilled? (please list name of each medication and dose if known)   metoprolol tartrate (LOPRESSOR) 100 MG tablet    2. Which pharmacy/location (including street and city if local pharmacy) is medication to be sent to?  [id number] -  ([place]), [place] - [address]      3. Do they need a 30 day or 90 day supply? 90 day

## 2023-11-19 ENCOUNTER — Other Ambulatory Visit: Payer: Self-pay | Admitting: Family Medicine

## 2023-11-20 NOTE — Telephone Encounter (Signed)
 Requested Prescriptions  Pending Prescriptions Disp Refills   omeprazole (PRILOSEC) 20 MG capsule [Pharmacy Med Name: Omeprazole 20 MG Oral Capsule Delayed Release] 60 capsule 0    Sig: Take 1 capsule by mouth twice daily     Gastroenterology: Proton Pump Inhibitors Passed - 11/20/2023  3:50 PM      Passed - Valid encounter within last 12 months    Recent Outpatient Visits           4 months ago Coronary artery disease involving native coronary artery of native heart without angina pectoris   [facility] Pickard, Priscille Heidelberg, MD   1 year ago Decreased pedal pulses   [place] [facility] Tanya Nones, Priscille Heidelberg, MD   1 year ago Right sided abdominal pain   [place] [facility] Donita Brooks, MD   1 year ago Side pain   [place] [facility] Donita Brooks, MD   1 year ago Skin pain   [place] [facility] Medicine Park Meo, FNP       Future Appointments             In 1 month Pickard, Priscille Heidelberg, MD [facility], [facility]   In 1 month Lennette Bihari, MD [facility] at [facility]

## 2023-11-22 ENCOUNTER — Ambulatory Visit: Payer: Self-pay

## 2023-11-22 ENCOUNTER — Encounter: Payer: Self-pay | Admitting: Family Medicine

## 2023-11-22 ENCOUNTER — Ambulatory Visit: Admitting: Family Medicine

## 2023-11-22 VITALS — BP 118/70 | HR 73 | Temp 98.5°F | Ht 69.0 in | Wt 216.2 lb

## 2023-11-22 DIAGNOSIS — J209 Acute bronchitis, unspecified: Secondary | ICD-10-CM

## 2023-11-22 MED ORDER — AZITHROMYCIN 250 MG PO TABS: ORAL_TABLET | ORAL | 0 refills | Status: AC

## 2023-11-22 MED ORDER — ALBUTEROL SULFATE HFA 108 (90 BASE) MCG/ACT IN AERS
1.0000 | INHALATION_SPRAY | Freq: Four times a day (QID) | RESPIRATORY_TRACT | 0 refills | Status: AC | PRN
Start: 2023-11-22 — End: ?

## 2023-11-22 MED ORDER — BENZONATATE 100 MG PO CAPS: 100.0000 mg | ORAL_CAPSULE | Freq: Three times a day (TID) | ORAL | 0 refills | Status: DC | PRN

## 2023-11-22 NOTE — Telephone Encounter (Signed)
  Chief Complaint: cough Symptoms: cough, congestion,  Frequency: about 2 weeks Pertinent Negatives: Patient denies fever, SOB, hemoptysis,  Disposition: [] ED /[] Urgent Care (no appt availability in office) / [x] Appointment(In office/virtual)/ []  [place] [facility]/ [] Home Care/ [] Refused Recommended Disposition /[] [place] [facility]/ []  Follow-up with PCP Additional Notes: Pt states that he has been dealing with "cold symptoms" for about 2 weeks. Requesting to see someone and get an inhaler. Pt states that he can feel a rattle in his chest, denies CP, denies difficulty breathing. Pt states that he has hx of bronchitis. Pt states he feels he is wheezing. Pt speaking in full sentences.  Copied from CRM [id number]. Topic: Clinical - Red Word Triage >> Nov 22, 2023  8:09 AM Ivette P wrote: [name] that prompted transfer to Nurse Triage: Wheezing, cough and congestion Reason for Disposition  Wheezing is present  Answer Assessment - Initial Assessment Questions 1. ONSET: "When did the cough begin?"      About 2 weeks 2. SEVERITY: "How bad is the cough today?"      Worse last night than this morning 3. SPUTUM: "Describe the color of your sputum" (none, dry cough; clear, white, yellow, green)     yellow 4. HEMOPTYSIS: "Are you coughing up any blood?" If so ask: "How much?" (flecks, streaks, tablespoons, etc.)     denies 5. DIFFICULTY BREATHING: "Are you having difficulty breathing?" If Yes, ask: "How bad is it?" (e.g., mild, moderate, severe)    - MILD: No SOB at rest, mild SOB with walking, speaks normally in sentences, can lie down, no retractions, pulse < 100.    - MODERATE: SOB at rest, SOB with minimal exertion and prefers to sit, cannot lie down flat, speaks in phrases, mild retractions, audible wheezing, pulse 100-120.    - SEVERE: Very SOB at rest, speaks in single words, struggling to breathe, sitting hunched forward, retractions, pulse > 120      denies 6. FEVER: "Do you have a  fever?" If Yes, ask: "What is your temperature, how was it measured, and when did it start?"     Denies  7. CARDIAC HISTORY: "Do you have any history of heart disease?" (e.g., heart attack, congestive heart failure)      Bypass 2011 8. LUNG HISTORY: "Do you have any history of lung disease?"  (e.g., pulmonary embolus, asthma, emphysema)     Bronchitis hx,  9. PE RISK FACTORS: "Do you have a history of blood clots?" (or: recent major surgery, recent prolonged travel, bedridden)     denies 10. OTHER SYMPTOMS: "Do you have any other symptoms?" (e.g., runny nose, wheezing, chest pain)       Wheezing, denies CP, congested nose 12. TRAVEL: "Have you traveled out of the country in the last month?" (e.g., travel history, exposures)       denies  Protocols used: Cough - Acute Productive-A-AH

## 2023-11-22 NOTE — Progress Notes (Signed)
 Patient Office Visit  Assessment & Plan:  Acute bronchitis, unspecified organism -     Benzonatate; Take 1 capsule (100 mg total) by mouth 3 (three) times daily as needed.  Dispense: 30 capsule; Refill: 0 -     Albuterol Sulfate HFA; Inhale 1-2 puffs into the lungs every 6 (six) hours as needed for wheezing or shortness of breath.  Dispense: 1 each; Refill: 0 -     Azithromycin; Take 2 tablets on day 1, then 1 tablet daily on days 2 through 5  Dispense: 6 tablet; Refill: 0   Tessalon Perles and albuterol inhaler sent to the pharmacy.  Z-Pak was sent but patient will hold off unless he worsens in the next few days.  Patient will keep [place] updated on his progress.  If worsening symptoms over the weekend he can seek urgent care/ER evaluation. No follow-ups on file.   Subjective:    Patient ID: Don Aguilar, male    DOB: Jan 22, 1951  Age: 73 y.o. MRN: [id number]  Chief Complaint  Patient presents with   Cough    Productive cough x 2 weeks.     Cough  73 year old with PMH for CAD, HTN, prediabetes, hyperlipidemia comes with  coughing x 2 weeks, wheezing at night time. Pt may have history of allergies but has not been taking anything for allergies. Pt does have congestion, postnasal drip, and sinus pressure.  Pt thinks he had chills initially but not anymore. Pt's wife heard him wheezing last night and told him to see the doctor. Pt has taken OTC Theraflu but did not the past few days. Took cold and cough for HBP but not sure if it helped. Pt finally slept last night, woke up around 4:30 AM.  Patient did receive the flu shot this season.  Of note patient has been taking ramipril for a long time and did not develop a cough at that initially.  Patient does not think it is related to this medication but will continue to monitor this. Patient does not have a history of asthma or COPD.  Patient is a non-smoker.  Wife is sick at home.  Patient thinks he is turning the corner but wanted to make  sure.   The ASCVD Risk score (Arnett DK, et al., 2019) failed to calculate for the following reasons:   The valid total cholesterol range is 130 to 320 mg/dL  Past Medical History:  Diagnosis Date   Coronary artery disease    GERD (gastroesophageal reflux disease)    Hyperlipidemia    Hypertension 2010   Past Surgical History:  Procedure Laterality Date   CARDIAC CATHETERIZATION  06/08/2009   No intervention - recommend CABG   CARDIOVASCULAR STRESS TEST  05/30/2012   Small area of possible ischemia in basal septal wall. ECG negative for ischemia, but frequent PVCs continued from mid to peak exercise with  Bigeminy pattern.   CORONARY ARTERY BYPASS GRAFT  06/10/2009   x4. LIMA to LAD, SVG to first diag, SVG to circumflex, SVG to RCA. Endoscopic vein harvest from right thigh and right lower leg.   HAND TENDON SURGERY  left hand, 1972   TEE WITHOUT CARDIOVERSION  06/10/2009   EF 60-65%, Normal-trace findings   VASCULAR EVALUATION  06/09/2009   No significant extracranial carotid artery stenosis demonstrated. Vertebrals are patent with antegrade flow.   Social History   Tobacco Use   Smoking status: Former    Current packs/day: 0.00    Average packs/day: 2.0  packs/day for 13.0 years (26.0 ttl pk-yrs)    Types: Cigarettes    Start date: 03/16/1963    Quit date: 03/15/1976    Years since quitting: 47.7   Smokeless tobacco: Never  Substance Use Topics   Alcohol use: No    Alcohol/week: 0.0 standard drinks of alcohol   Drug use: No   Family History  Problem Relation Age of Onset   Heart failure Mother    Heart failure Father    Cancer Sister    Allergies  Allergen Reactions   Niaspan [Niacin Er (Antihyperlipidemic)] Itching    Lower legs    Review of Systems  Respiratory:  Positive for cough.       Objective:    BP 118/70   Pulse 73   Temp 98.5 F (36.9 C)   Ht 5\' 9"  (1.753 m)   Wt 216 lb 4 oz (98.1 kg)   SpO2 99%   BMI 31.93 kg/m  BP Readings from Last 3  Encounters:  11/22/23 118/70  06/27/23 118/80  12/03/22 130/70   Wt Readings from Last 3 Encounters:  11/22/23 216 lb 4 oz (98.1 kg)  06/27/23 217 lb 2 oz (98.5 kg)  12/03/22 220 lb 3.2 oz (99.9 kg)    Physical Exam Vitals and nursing note reviewed.  Constitutional:      General: He is not in acute distress.    Appearance: Normal appearance.  HENT:     Head: Normocephalic.     Right Ear: Tympanic membrane, ear canal and external ear normal.     Left Ear: Tympanic membrane, ear canal and external ear normal.     Nose: Congestion present.     Mouth/Throat:     Pharynx: No oropharyngeal exudate or posterior oropharyngeal erythema.     Comments: Postnasal drip, Uvula slightly edematous Eyes:     Extraocular Movements: Extraocular movements intact.     Conjunctiva/sclera: Conjunctivae normal.     Pupils: Pupils are equal, round, and reactive to light.  Cardiovascular:     Rate and Rhythm: Normal rate and regular rhythm.     Heart sounds: Normal heart sounds.  Pulmonary:     Effort: Pulmonary effort is normal.     Breath sounds: Wheezing and rhonchi present.     Comments: Pt has coarse breath sounds posteriorly. Pt has faint expiratory wheezes throughout. Pt has no retractions.  Neurological:     General: No focal deficit present.     Mental Status: He is alert and oriented to person, place, and time.  Psychiatric:        Mood and Affect: Mood normal.        Behavior: Behavior normal.      No results found for any visits on 11/22/23.

## 2023-12-17 ENCOUNTER — Other Ambulatory Visit: Payer: Self-pay | Admitting: Family Medicine

## 2023-12-18 NOTE — Telephone Encounter (Signed)
 Requested Prescriptions  Pending Prescriptions Disp Refills   hydrochlorothiazide  (HYDRODIURIL ) 25 MG tablet [Pharmacy Med Name: hydroCHLOROthiazide  25 MG Oral Tablet] 90 tablet 0    Sig: TAKE 1 TABLET BY MOUTH ONCE DAILY . APPOINTMENT REQUIRED FOR FUTURE REFILLS     Cardiovascular: Diuretics - Thiazide Passed - 12/18/2023 10:31 AM      Passed - Cr in normal range and within 180 days    Creat  Date Value Ref Range Status  06/27/2023 1.22 0.70 - 1.28 mg/dL Final         Passed - K in normal range and within 180 days    Potassium  Date Value Ref Range Status  06/27/2023 3.7 3.5 - 5.3 mmol/L Final         Passed - Na in normal range and within 180 days    Sodium  Date Value Ref Range Status  06/27/2023 138 135 - 146 mmol/L Final  04/25/2017 143 134 - 144 mmol/L Final         Passed - Last BP in normal range    BP Readings from Last 1 Encounters:  11/22/23 118/70         Passed - Valid encounter within last 6 months    Recent Outpatient Visits           3 weeks ago Acute bronchitis, unspecified organism   [facility] Amadeo June, MD   5 months ago Coronary artery disease involving native coronary artery of native heart without angina pectoris   [place] [facility] Pickard, Cisco Crest, MD   1 year ago Decreased pedal pulses   [place] [facility] Cheril Cork, Cisco Crest, MD   1 year ago Right sided abdominal pain   [place] [facility] Medicine Austine Lefort, MD   1 year ago Side pain    [facility] Pickard, Cisco Crest, MD       Future Appointments             In 5 days Pickard, Cisco Crest, MD [facility], [facility]   In 2 weeks Millicent Ally, MD [facility] at [id number]

## 2023-12-23 ENCOUNTER — Ambulatory Visit (INDEPENDENT_AMBULATORY_CARE_PROVIDER_SITE_OTHER): Payer: Medicare Other | Admitting: Family Medicine

## 2023-12-23 ENCOUNTER — Encounter: Payer: Self-pay | Admitting: Family Medicine

## 2023-12-23 VITALS — BP 114/62 | HR 53 | Temp 97.8°F | Ht 69.0 in | Wt 217.4 lb

## 2023-12-23 DIAGNOSIS — I2510 Atherosclerotic heart disease of native coronary artery without angina pectoris: Secondary | ICD-10-CM

## 2023-12-23 DIAGNOSIS — R7303 Prediabetes: Secondary | ICD-10-CM

## 2023-12-23 DIAGNOSIS — E785 Hyperlipidemia, unspecified: Secondary | ICD-10-CM | POA: Diagnosis not present

## 2023-12-23 NOTE — Progress Notes (Signed)
 Subjective:    Patient ID: Don Aguilar, male    DOB: 23-Jan-1951, 73 y.o.   MRN: [id number]   Patient is a very pleasant 73 year old African-American gentleman who has a history of coronary artery disease.  This was treated with CABG. patient denies any chest pain, shortness of breath, dyspnea on exertion.  He states that his shoulder pain that he had at his last visit has resolved.Don Aguilar  His blood pressure today is outstanding at 114/62.  He denies any polyuria, polydipsia, blurry vision.  He denies any abdominal pain or nausea or vomiting. Past Medical History:  Diagnosis Date   Coronary artery disease    GERD (gastroesophageal reflux disease)    Hyperlipidemia    Hypertension 2010   Past Surgical History:  Procedure Laterality Date   CARDIAC CATHETERIZATION  06/08/2009   No intervention - recommend CABG   CARDIOVASCULAR STRESS TEST  05/30/2012   Small area of possible ischemia in basal septal wall. ECG negative for ischemia, but frequent PVCs continued from mid to peak exercise with  Bigeminy pattern.   CORONARY ARTERY BYPASS GRAFT  06/10/2009   x4. LIMA to LAD, SVG to first diag, SVG to circumflex, SVG to RCA. Endoscopic vein harvest from right thigh and right lower leg.   HAND TENDON SURGERY  left hand, 1972   TEE WITHOUT CARDIOVERSION  06/10/2009   EF 60-65%, Normal-trace findings   VASCULAR EVALUATION  06/09/2009   No significant extracranial carotid artery stenosis demonstrated. Vertebrals are patent with antegrade flow.   Current Outpatient Medications on File Prior to Visit  Medication Sig Dispense Refill   albuterol  (VENTOLIN  HFA) 108 (90 Base) MCG/ACT inhaler Inhale 1-2 puffs into the lungs every 6 (six) hours as needed for wheezing or shortness of breath. 1 each 0   aspirin 81 MG tablet Take 81 mg by mouth daily.     hydrochlorothiazide  (HYDRODIURIL ) 25 MG tablet TAKE 1 TABLET BY MOUTH ONCE DAILY . APPOINTMENT REQUIRED FOR FUTURE REFILLS 90 tablet 0   metoprolol  tartrate  (LOPRESSOR ) 100 MG tablet Take 1 tablet (100 mg total) by mouth 2 (two) times daily. 180 tablet 0   omeprazole  (PRILOSEC) 20 MG capsule Take 1 capsule by mouth twice daily 180 capsule 0   ramipril  (ALTACE ) 10 MG capsule Take 1 capsule by mouth once daily 90 capsule 1   rosuvastatin  (CRESTOR ) 40 MG tablet Take 1 tablet by mouth once daily 90 tablet 0   No current facility-administered medications on file prior to visit.   Allergies  Allergen Reactions   Niaspan [Niacin Er (Antihyperlipidemic)] Itching    Lower legs   Social History   Socioeconomic History   Marital status: Married    Spouse name: Not on file   Number of children: Not on file   Years of education: Not on file   Highest education level: Not on file  Occupational History   Not on file  Tobacco Use   Smoking status: Former    Current packs/day: 0.00    Average packs/day: 2.0 packs/day for 13.0 years (26.0 ttl pk-yrs)    Types: Cigarettes    Start date: 03/16/1963    Quit date: 03/15/1976    Years since quitting: 47.8   Smokeless tobacco: Never  Substance and Sexual Activity   Alcohol use: No    Alcohol/week: 0.0 standard drinks of alcohol   Drug use: No   Sexual activity: Not on file  Other Topics Concern   Not on file  Social History  Narrative   Not on file   Social Drivers of Health   Financial Resource Strain: Patient Declined (06/26/2023)   Overall Financial Resource Strain (CARDIA)    Difficulty of Paying Living Expenses: Patient declined  Food Insecurity: Patient Declined (06/26/2023)   Hunger Vital Sign    Worried About Running Out of Food in the Last Year: Patient declined    Ran Out of Food in the Last Year: Patient declined  Transportation Needs: Patient Declined (06/26/2023)   PRAPARE - Administrator, Civil Service (Medical): Patient declined    Lack of Transportation (Non-Medical): Patient declined  Physical Activity: Insufficiently Active (06/26/2023)   Exercise Vital Sign     Days of Exercise per Week: 1 day    Minutes of Exercise per Session: 20 min  Stress: Patient Declined (06/26/2023)   [employer] of Occupational Health - Occupational Stress Questionnaire    Feeling of Stress : Patient declined  Social Connections: Unknown (06/26/2023)   Social Connection and Isolation Panel [NHANES]    Frequency of Communication with Friends and Family: Patient declined    Frequency of Social Gatherings with Friends and Family: Once a week    Attends Religious Services: Patient declined    Database administrator or Organizations: Patient declined    Attends Engineer, structural: Not on file    Marital Status: Married  Catering manager Violence: Not on file      Review of Systems  Gastrointestinal:  Positive for diarrhea.  All other systems reviewed and are negative.      Objective:   Physical Exam Vitals reviewed.  Constitutional:      Appearance: Normal appearance.  Cardiovascular:     Rate and Rhythm: Normal rate and regular rhythm.     Heart sounds: Normal heart sounds. No murmur heard.    No friction rub. No gallop.  Pulmonary:     Effort: Pulmonary effort is normal. No respiratory distress.     Breath sounds: Normal breath sounds. No stridor.  Abdominal:     General: Bowel sounds are normal. There is no distension.     Palpations: Abdomen is soft.     Tenderness: There is no abdominal tenderness. There is no guarding or rebound.  Musculoskeletal:     Right lower leg: No edema.     Left lower leg: No edema.  Feet:     Right foot:     Skin integrity: Skin integrity normal. No ulcer.     Left foot:     Skin integrity: Skin integrity normal.  Neurological:     Mental Status: He is alert.           Assessment & Plan:  Coronary artery disease involving native coronary artery of native heart without angina pectoris - Plan: CBC with Differential/Platelet, COMPLETE METABOLIC PANEL WITHOUT GFR, Lipid panel, Hemoglobin  A1c  Hyperlipidemia with target LDL less than 70 - Plan: CBC with Differential/Platelet, COMPLETE METABOLIC PANEL WITHOUT GFR, Lipid panel, Hemoglobin A1c  Prediabetes - Plan: CBC with Differential/Platelet, COMPLETE METABOLIC PANEL WITHOUT GFR, Lipid panel, Hemoglobin A1c Blood pressure is outstanding today.  Patient denies any syncope or lightheadedness or orthostatic dizziness.  I will check a CBC a CMP and a lipid panel and a hemoglobin A1c.  Goal LDL is less than 55.  Blood pressure is outstanding and requires no changes.  Goal hemoglobin A1c is less than 6.5.  If elevated I would add Jardiance.

## 2023-12-24 LAB — HEMOGLOBIN A1C
Hgb A1c MFr Bld: 6.3 % — ABNORMAL HIGH (ref <5.7)
Mean Plasma Glucose: 134 mg/dL
eAG (mmol/L): 7.4 mmol/L

## 2023-12-24 LAB — COMPLETE METABOLIC PANEL WITHOUT GFR
AG Ratio: 1.6 (calc) (ref 1.0–2.5)
ALT: 16 U/L (ref 9–46)
AST: 20 U/L (ref 10–35)
Albumin: 4.1 g/dL (ref 3.6–5.1)
Alkaline phosphatase (APISO): 68 U/L (ref 35–144)
BUN: 18 mg/dL (ref 7–25)
CO2: 29 mmol/L (ref 20–32)
Calcium: 8.9 mg/dL (ref 8.6–10.3)
Chloride: 98 mmol/L (ref 98–110)
Creat: 1.18 mg/dL (ref 0.70–1.28)
Globulin: 2.5 g/dL (ref 1.9–3.7)
Glucose, Bld: 89 mg/dL (ref 65–99)
Potassium: 3.9 mmol/L (ref 3.5–5.3)
Sodium: 137 mmol/L (ref 135–146)
Total Bilirubin: 0.6 mg/dL (ref 0.2–1.2)
Total Protein: 6.6 g/dL (ref 6.1–8.1)

## 2023-12-24 LAB — LIPID PANEL
Cholesterol: 96 mg/dL (ref <200)
HDL: 29 mg/dL — ABNORMAL LOW (ref >=40)
LDL Cholesterol (Calc): 44 mg/dL
Non-HDL Cholesterol (Calc): 67 mg/dL (ref <130)
Total CHOL/HDL Ratio: 3.3 (calc) (ref <5.0)
Triglycerides: 148 mg/dL (ref <150)

## 2023-12-24 LAB — CBC WITH DIFFERENTIAL/PLATELET
Absolute Lymphocytes: 2192 {cells}/uL (ref 850–3900)
Absolute Monocytes: 835 {cells}/uL (ref 200–950)
Basophils Absolute: 39 {cells}/uL (ref 0–200)
Basophils Relative: 0.5 %
Eosinophils Absolute: 172 {cells}/uL (ref 15–500)
Eosinophils Relative: 2.2 %
HCT: 43.2 % (ref 38.5–50.0)
Hemoglobin: 14 g/dL (ref 13.2–17.1)
MCH: 29.4 pg (ref 27.0–33.0)
MCHC: 32.4 g/dL (ref 32.0–36.0)
MCV: 90.8 fL (ref 80.0–100.0)
MPV: 12.7 fL — ABNORMAL HIGH (ref 7.5–12.5)
Monocytes Relative: 10.7 %
Neutro Abs: 4563 {cells}/uL (ref 1500–7800)
Neutrophils Relative %: 58.5 %
Platelets: 178 10*3/uL (ref 140–400)
RBC: 4.76 10*6/uL (ref 4.20–5.80)
RDW: 12.5 % (ref 11.0–15.0)
Total Lymphocyte: 28.1 %
WBC: 7.8 10*3/uL (ref 3.8–10.8)

## 2024-01-02 ENCOUNTER — Ambulatory Visit: Payer: Medicare Other | Attending: Cardiovascular Disease | Admitting: Cardiovascular Disease

## 2024-01-02 ENCOUNTER — Encounter: Payer: Self-pay | Admitting: Cardiovascular Disease

## 2024-01-02 DIAGNOSIS — Z951 Presence of aortocoronary bypass graft: Secondary | ICD-10-CM

## 2024-01-02 DIAGNOSIS — I340 Nonrheumatic mitral (valve) insufficiency: Secondary | ICD-10-CM

## 2024-01-02 DIAGNOSIS — K219 Gastro-esophageal reflux disease without esophagitis: Secondary | ICD-10-CM | POA: Diagnosis not present

## 2024-01-02 DIAGNOSIS — I071 Rheumatic tricuspid insufficiency: Secondary | ICD-10-CM | POA: Diagnosis not present

## 2024-01-02 DIAGNOSIS — I493 Ventricular premature depolarization: Secondary | ICD-10-CM

## 2024-01-02 DIAGNOSIS — I2510 Atherosclerotic heart disease of native coronary artery without angina pectoris: Secondary | ICD-10-CM

## 2024-01-02 DIAGNOSIS — E785 Hyperlipidemia, unspecified: Secondary | ICD-10-CM | POA: Diagnosis not present

## 2024-01-02 MED ORDER — RAMIPRIL 5 MG PO CAPS: 5.0000 mg | ORAL_CAPSULE | Freq: Every day | ORAL | 3 refills | Status: AC

## 2024-01-02 NOTE — Progress Notes (Signed)
 Cardiology Office Note    Date:  01/04/2024   ID:  [name], DOB 10-20-1950, MRN [id number]  PCP:  Austine Lefort, MD  Cardiologist:  Magnus Schuller, MD   51-month follow-up evaluation  History of Present Illness:  Don Aguilar is a 73 y.o. male who I evaluated in a telemedicine visit in April 2020.  I had not seen him in almost 4 years and saw him in follow-up on February and April 2024.  He presents for a 13 month follow-up evaluation.  Don Aguilar was found to have severe multivessel CAD in October 2010 and underwent CABG revascularization surgery with Dr. Eleno Griffins with a LIMA to LAD, SVG to first diagonal, SVG to circumflex marginal, and SVG to distal RCA.  Problems included hypertension, obesity, mixed hyperlipidemia.  In 2013 he was found to have mild obstructive sleep apnea with an AHI of 8.5/h, RDI was 25.4/h and during REM sleep AHI was 10.8/h.  He underwent CPAP titration study and 11 cm water pressure was recommended but he never followed up with CPAP therapy.  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  He had issues of palpitations and shortness of breath with walking up steps.  At that time he was on metoprolol  tartrate 150 mg twice a day which controlled his palpitations.  Apparently when he went for renewal he was inadvertently only given 25 mg.  His follow-up, I recommended further titration to twice a day and he noted resolution of his prior palpitations.  An echo Doppler study on January 31, 2017 showed EF 60 to 65% with normal diastolic function.  Atrial septal thickness was increased consistent with lipomatous hypertrophy.  I had not seen him in the office since his April 09, 2017 evaluation.  He was evaluated virtually on November 28, 2018 for the COVID pandemic.  At that time he felt he was sleeping well.  Palpitations were controlled.  He was evaluated by Marcie Sever PA in a virtual visit on September 02, 2020.  At that time he  was noticing some more palpitations.  A Zio patch monitor was ordered and his predominant rhythm was sinus with an average rate at 69 bpm minimum heart rate of 51.  He had 3 episodes of nonsustained VT.  The episode with the fastest and longest interval lasted 8 beats with an average rate of 176.  There were rare isolated PACs and atrial couplets.  He had frequent PVCs with rare couplets and triplets.  There were episodes of ventricular bigeminy and trigeminy.  There were no episodes of atrial fibrillation or pauses.  Apparently there were multiple attempts at contacting him following this evaluation with a message to call back as well as placing a letter in the mail.  Apparently there was no follow-up.  When I saw him on September 28, 2022 he was continuing to be followed by Dr. Cheril Cork at [facility]. He denied any chest pain or shortness of breath.  At times he notes some irregular rhythm but essentially does not pay attention to it.  He believes he is sleeping fairly well typically going to bed around 1030 and waking up at 630.  Has had some mild weight gain.  He is not as active.  He is fully retired.  He underwent laboratory Dr. Cheril Cork on June 15, 2022 which showed total cholesterol 97 HDL 28 LDL 44 triglycerides 169 on rosuvastatin  40  mg.  Hemoglobin A1c was 6.2.  12/22/2021 creatinine was 1.18.  Potassium 3.9.  Currently he is on metoprolol  tartrate just at 75 mg twice a day and takes HCTZ 25 mg as needed for fluid and ramipril  10 mg daily for blood pressure control.  He also takes omeprazole  and is on baby aspirin.  He denies any recurrent chest pain.  He denies presyncope or syncope.  He denies significant leg edema.  During that evaluation, I reviewed data over the 4 years prior to my evaluation.  His blood pressure was stable on a regimen of chlorothiazide which he takes as needed, ramipril  10 mg in addition to metoprolol  tartrate 75 mg twice a day.  His ECG showed ventricular  bigeminal rhythm with an average rate at 69 bpm with T wave abnormality inferiorly and anterolaterally which were old.  I recommended further titration of metoprolol  to 100 mg twice a day.  I scheduled him for a follow-up echo Doppler assessment.  He continues to be on rosuvastatin  40 mg for hyperlipidemia with most recent LDL at 44.  He believes he was sleeping well.  He underwent an echo Doppler study on October 01, 2022.  He had normal LV function with EF 60 to 65% without wall motion abnormality.  There was normal RV function and normal pulmonary artery systolic pressure.  There was mild MR, moderate TR.  He subsequently was referred by Dr. Cheril Cork for a lower extremity Doppler study which was done on November 01, 2022 which showed mild resting right ankle-brachial index abnormality at 0.84 with normal right toe brachial index.  Left ABI was normal.  I saw him in follow-up on December 03, 2022 at which time he felt well and denied any chest pain, shortness of breath or claudication symptomatology.  He continued to be on hydrochlorothiazide  25 mg, metoprolol  tartrate 100 mg twice a day and ramipril  10 mg for hypertension.  He is on rosuvastatin  40 mg for hyperlipidemia and omeprazole  20 mg twice daily for GERD.  He takes a baby aspirin.  Blood pressure was stable.  He had undergone lower extremity Doppler studies ordered by Dr. Cheril Cork which showed mild reduction in ABI in the right at 0.84 with normal toe brachial index.  The left ABI was normal.  Presently, he feels well and denies chest pain or shortness of breath.  He recently saw Dr. Cheril Cork on December 23, 2023 and remained stable.  He has continued to be on aspirin 81 mg, HCTZ 25 mg, metoprolol  tartrate 100 mg twice a day and ramipril  10 mg daily for hypertension.  He is on rosuvastatin  40 mg for hyperlipidemia.  He denies any claudication symptomatology.  He underwent laboratory on December 23, 2023.  CBC was stable.  Creatinine was 1.18.  LFTs were normal.   Lipid studies show total cholesterol 96, triglycerides 148, HDL 29, and LDL 44.  He presents for evaluation.   Past Medical History:  Diagnosis Date   Coronary artery disease    GERD (gastroesophageal reflux disease)    Hyperlipidemia    Hypertension 2010    Past Surgical History:  Procedure Laterality Date   CARDIAC CATHETERIZATION  06/08/2009   No intervention - recommend CABG   CARDIOVASCULAR STRESS TEST  05/30/2012   Small area of possible ischemia in basal septal wall. ECG negative for ischemia, but frequent PVCs continued from mid to peak exercise with  Bigeminy pattern.   CORONARY ARTERY BYPASS GRAFT  06/10/2009   x4. LIMA to LAD, SVG to  first diag, SVG to circumflex, SVG to RCA. Endoscopic vein harvest from right thigh and right lower leg.   HAND TENDON SURGERY  left hand, 1972   TEE WITHOUT CARDIOVERSION  06/10/2009   EF 60-65%, Normal-trace findings   VASCULAR EVALUATION  06/09/2009   No significant extracranial carotid artery stenosis demonstrated. Vertebrals are patent with antegrade flow.    Current Medications: Outpatient Medications Prior to Visit  Medication Sig Dispense Refill   albuterol  (VENTOLIN  HFA) 108 (90 Base) MCG/ACT inhaler Inhale 1-2 puffs into the lungs every 6 (six) hours as needed for wheezing or shortness of breath. 1 each 0   aspirin 81 MG tablet Take 81 mg by mouth daily.     hydrochlorothiazide  (HYDRODIURIL ) 25 MG tablet TAKE 1 TABLET BY MOUTH ONCE DAILY . APPOINTMENT REQUIRED FOR FUTURE REFILLS 90 tablet 0   metoprolol  tartrate (LOPRESSOR ) 100 MG tablet Take 1 tablet (100 mg total) by mouth 2 (two) times daily. 180 tablet 0   omeprazole  (PRILOSEC) 20 MG capsule Take 1 capsule by mouth twice daily 180 capsule 0   rosuvastatin  (CRESTOR ) 40 MG tablet Take 1 tablet by mouth once daily 90 tablet 0   ramipril  (ALTACE ) 10 MG capsule Take 1 capsule by mouth once daily 90 capsule 1   No facility-administered medications prior to visit.     Allergies:    Niaspan [niacin er (antihyperlipidemic)]   Social History   Socioeconomic History   Marital status: Married    Spouse name: Not on file   Number of children: Not on file   Years of education: Not on file   Highest education level: Not on file  Occupational History   Not on file  Tobacco Use   Smoking status: Former    Current packs/day: 0.00    Average packs/day: 2.0 packs/day for 13.0 years (26.0 ttl pk-yrs)    Types: Cigarettes    Start date: 03/16/1963    Quit date: 03/15/1976    Years since quitting: 47.8   Smokeless tobacco: Never  Vaping Use   Vaping status: Never Used  Substance and Sexual Activity   Alcohol use: No    Alcohol/week: 0.0 standard drinks of alcohol   Drug use: No   Sexual activity: Not on file  Other Topics Concern   Not on file  Social History Narrative   Not on file   Social Drivers of Health   Financial Resource Strain: Patient Declined (06/26/2023)   Overall Financial Resource Strain (CARDIA)    Difficulty of Paying Living Expenses: Patient declined  Food Insecurity: Patient Declined (06/26/2023)   Hunger Vital Sign    Worried About Running Out of Food in the Last Year: Patient declined    Ran Out of Food in the Last Year: Patient declined  Transportation Needs: Patient Declined (06/26/2023)   PRAPARE - Administrator, Civil Service (Medical): Patient declined    Lack of Transportation (Non-Medical): Patient declined  Physical Activity: Insufficiently Active (06/26/2023)   Exercise Vital Sign    Days of Exercise per Week: 1 day    Minutes of Exercise per Session: 20 min  Stress: Patient Declined (06/26/2023)   [employer] of Occupational Health - Occupational Stress Questionnaire    Feeling of Stress : Patient declined  Social Connections: Unknown (06/26/2023)   Social Connection and Isolation Panel [NHANES]    Frequency of Communication with Friends and Family: Patient declined    Frequency of Social Gatherings with  Friends and Family: Once a week  Attends Religious Services: Patient declined    Active Member of Clubs or Organizations: Patient declined    Attends Banker Meetings: Not on file    Marital Status: Married    Also history notable that he is married and 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  He has 3 children and several grandchildren.  Family History:  The patient's family history includes Cancer in his sister; Heart failure in his father and mother.   ROS General: Negative; No fevers, chills, or night sweats;  HEENT: Negative; No changes in vision or hearing, sinus congestion, difficulty swallowing Pulmonary: Negative; No cough, wheezing, shortness of breath, hemoptysis Cardiovascular: Negative; No chest pain, presyncope, syncope, palpitations GI: GERD GU: Negative; No dysuria, hematuria, or difficulty voiding Musculoskeletal: Negative; no myalgias, joint pain, or weakness Hematologic/Oncology: Negative; no easy bruising, bleeding Endocrine: Negative; no heat/cold intolerance; no diabetes Neuro: Negative; no changes in balance, headaches Skin: Negative; No rashes or skin lesions Psychiatric: Negative; No behavioral problems, depression Sleep: Negative; No snoring, daytime sleepiness, hypersomnolence, bruxism, restless legs, hypnogognic hallucinations, no cataplexy Other comprehensive 14 point system review is negative.   PHYSICAL EXAM:   VS:  BP 98/64 (BP Location: Right Arm, Patient Position: Sitting, Cuff Size: Large)   Pulse 69   Ht 5\' 9"  (1.753 m)   Wt 217 lb (98.4 kg)   SpO2 99%   BMI 32.05 kg/m    Repeat blood pressure by me was 98/62   Wt Readings from Last 3 Encounters:  01/02/24 217 lb (98.4 kg)  12/23/23 217 lb 6.4 oz (98.6 kg)  11/22/23 216 lb 4 oz (98.1 kg)    General: Alert, oriented, no distress.  Skin: normal turgor, no rashes, warm and dry HEENT: Normocephalic, atraumatic. Pupils equal round and reactive to light;  sclera anicteric; extraocular muscles intact; Nose without nasal septal hypertrophy Mouth/Parynx benign; Mallinpatti scale 3 Neck: No JVD, no carotid bruits; normal carotid upstroke Lungs: clear to ausculatation and percussion; no wheezing or rales Chest wall: without tenderness to palpitation Heart: PMI not displaced, RRR, s1 s2 normal, 1/6 systolic murmur, no diastolic murmur, no rubs, gallops, thrills, or heaves Abdomen: soft, nontender; no hepatosplenomehaly, BS+; abdominal aorta nontender and not dilated by palpation. Back: no CVA tenderness Pulses 2+ Musculoskeletal: full range of motion, normal strength, no joint deformities Extremities: no clubbing cyanosis or edema, Homan's sign negative  Neurologic: grossly nonfocal; Cranial nerves grossly wnl Psychologic: Normal mood and affect    Studies/Labs Reviewed:   EKG Interpretation Date/Time:  Thursday Jan 02 2024 11:10:20 EDT Ventricular Rate:  61 PR Interval:  136 QRS Duration:  80 QT Interval:  416 QTC Calculation: 418 R Axis:   13  Text Interpretation: Sinus bradycardia with frequent Premature ventricular complexes T wave abnormality, consider inferior ischemia T wave abnormality, consider anterolateral ischemia Confirmed by Magnus Schuller ([id number]) on 01/02/2024 11:59:38 AM    September 28, 2022 ECG (independently read by me): Sinus rhythm at 69; PVC with ventricular bigeminy, inferior and anterolateral T wave abnormality  April 09, 2017 ECG (independently read by me): Rhythm at 60 bpm with anterolateral T wave changes.  Recent Labs:    Latest Ref Rng & Units 12/23/2023   10:34 AM 06/27/2023   12:09 PM 08/23/2022   11:24 AM  BMP  Glucose 65 - 99 mg/dL 89  952  841   BUN 7 - 25 mg/dL 18  18  16    Creatinine 0.70 - 1.28 mg/dL 3.24  4.01  0.27  BUN/Creat Ratio 6 - 22 (calc) SEE NOTE:  SEE NOTE:  SEE NOTE:   Sodium 135 - 146 mmol/L 137  138  138   Potassium 3.5 - 5.3 mmol/L 3.9  3.7  3.9   Chloride 98 - 110 mmol/L 98  102   103   CO2 20 - 32 mmol/L 29  28  27    Calcium  8.6 - 10.3 mg/dL 8.9  8.8  8.7         Latest Ref Rng & Units 12/23/2023   10:34 AM 06/27/2023   12:09 PM 06/15/2022    8:23 AM  Hepatic Function  Total Protein 6.1 - 8.1 g/dL 6.6  6.3  6.6   AST 10 - 35 U/L 20  20  22    ALT 9 - 46 U/L 16  15  22    Total Bilirubin 0.2 - 1.2 mg/dL 0.6  0.6  0.4        Latest Ref Rng & Units 12/23/2023   10:34 AM 06/27/2023   12:09 PM 08/23/2022   11:24 AM  CBC  WBC 3.8 - 10.8 Thousand/uL 7.8  7.3  6.6   Hemoglobin 13.2 - 17.1 g/dL 16.1  09.6  04.5   Hematocrit 38.5 - 50.0 % 43.2  42.1  42.4   Platelets 140 - 400 Thousand/uL 178  174  154    Lab Results  Component Value Date   MCV 90.8 12/23/2023   MCV 90.9 06/27/2023   MCV 89.6 08/23/2022   Lab Results  Component Value Date   TSH 2.76 01/30/2017   Lab Results  Component Value Date   HGBA1C 6.3 (H) 12/23/2023     BNP No results found for: "BNP"  ProBNP No results found for: "PROBNP"   Lipid Panel     Component Value Date/Time   CHOL 96 12/23/2023 1034   CHOL 88 (L) 04/25/2017 0832   TRIG 148 12/23/2023 1034   HDL 29 (L) 12/23/2023 1034   HDL 31 (L) 04/25/2017 0832   CHOLHDL 3.3 12/23/2023 1034   VLDL 47 (H) 01/30/2017 0930   LDLCALC 44 12/23/2023 1034   LABVLDL 20 04/25/2017 0832     RADIOLOGY: No results found.   Additional studies/ records that were reviewed today include:  Reviewed the patient's prior records.  Telemedicine visit from Marcie Sever were reviewed was reviewed as well as recent evaluation by Dr. Eliane Grooms from June 22, 2022.  ASSESSMENT:    1. CAD in native artery   2. S/P CABG (coronary artery bypass graft): 2010   3. Hyperlipidemia with target LDL less than 70   4. Frequent PVCs; improved   5. Mild mitral regurgitation   6. Moderate tricuspid regurgitation   7. Gastroesophageal reflux disease without esophagitis     PLAN:  Don Aguilar OSA 73 year-old African-American male  who is status post CABG revascularization surgery in October 2010 with a LIMA to LAD, SVG to first diagonal, SVG to circumflex and SVG to distal RCA.  He has had a history of palpitations and remotely had been controlled with metoprolol  tartrate previously up to 150 twice a day.  However, over the past appears he has just been on 75 mg twice a day.  Echo Doppler study in June 2018 showed EF 60 to 65% with moderate LVH without significant valvular pathology.   At his office visit in February 2024 with me after having not evaluated him in approximately 4 years, he was in ventricular bigeminy.  At  that time metoprolol  was increased to 100 mg twice a day.  He has felt well and has noticed resolution of any palpitations.  His echo Doppler study from October 01, 2022 showed normal LV systolic function.  He did not have any wall motion abnormalities.  He had normal RV function with normal pulmonary pressures.  There was mild MR, moderate TR. at his April 2020 for evaluation with me, blood pressure was excellent his blood pressure today is excellent on his regimen now consisting of metoprolol  tartrate 100 mg twice a day, ramipril  10 mg daily, in addition to HCTZ 25 mg.  He is also on rosuvastatin  40 mg.  LDL cholesterol in October 2023 was excellent at 44.  He had undergone lower extremity Doppler studies ordered by Dr. Cheril Cork.  He does have mild reduction in ABI in the right at 0.84 with normal toe brachial index.  The left ABI was normal and there was abnormality to the left toe brachial index.  He denies any significant claudication symptoms and is followed for this by Dr. Cheril Cork.  Today, blood pressure is low at 98/64.  He does have sinus rhythm with occasional PVC.  He has been on metoprolol  tartrate 100 mg twice a day, HCTZ 25 mg daily in addition to ramipril  10 mg.  I have recommended reduction of his ramipril  dose to 5 mg daily.  He continues to be on rosuvastatin  40 mg for hyperlipidemia and takes baby aspirin.   I reviewed most recent laboratory from December 23, 2023 which showed total cholesterol 96 HDL 29 triglycerides 148 and LDL cholesterol excellent at 44.  Renal function is stable with creatinine at 1.18.  Hemoglobin A1c was 6.3 consistent with prediabetes.  I have recommended the next time he gets laboratory checked that LP(a) be obtained.  I again reviewed his echo Doppler study from 2024 which showed EF 60 to 65% with mild MR and moderate TR.  I discussed with him today my plans for retirement at the end of next month.  Clinically he is doing well.  I will transition him to the care of Dr. Randene Bustard and plan a 65-month follow-up Cardiologic evaluation.   Medication Adjustments/Labs and Tests Ordered: Current medicines are reviewed at length with the patient today.  Concerns regarding medicines are outlined above.  Medication changes, Labs and Tests ordered today are listed in the Patient Instructions below. Patient Instructions  Medication Instructions:  Your physician has recommended you make the following change in your medication:  DECREASE RAMIPRIL  TO 5 MG DAILY.    *If you need a refill on your cardiac medications before your next appointment, please call your pharmacy*  Lab Work: NONE If you have labs (blood work) drawn today and your tests are completely normal, you will receive your results only by: MyChart Message (if you have MyChart) OR A paper copy in the mail If you have any lab test that is abnormal or we need to change your treatment, we will call you to review the results.  Testing/Procedures: NONE  Follow-Up: At [facility], you and your health needs are our priority.  As part of our continuing mission to provide you with exceptional heart care, our providers are all part of one team.  This team includes your primary Cardiologist (physician) and Advanced Practice Providers or APPs (Physician Assistants and Nurse Practitioners) who all work together to provide you  with the care you need, when you need it.  Your next appointment:   6 month(s)  Provider:   DR. HARDING   We recommend signing up for the patient portal called "MyChart".  Sign up information is provided on this After Visit Summary.  MyChart is used to connect with patients for Virtual Visits (Telemedicine).  Patients are able to view lab/test results, encounter notes, upcoming appointments, etc.  Non-urgent messages can be sent to your provider as well.   To learn more about what you can do with MyChart, go to [email or web address].   Other Instructions        Signed, Magnus Schuller, MD  01/04/2024 10:05 AM    [facility] [address], [place], [place]  [address] Phone: [phone]

## 2024-01-02 NOTE — Patient Instructions (Signed)
 Medication Instructions:  Your physician has recommended you make the following change in your medication:  DECREASE RAMIPRIL  TO 5 MG DAILY.    *If you need a refill on your cardiac medications before your next appointment, please call your pharmacy*  Lab Work: NONE If you have labs (blood work) drawn today and your tests are completely normal, you will receive your results only by: MyChart Message (if you have MyChart) OR A paper copy in the mail If you have any lab test that is abnormal or we need to change your treatment, we will call you to review the results.  Testing/Procedures: NONE  Follow-Up: At [facility], you and your health needs are our priority.  As part of our continuing mission to provide you with exceptional heart care, our providers are all part of one team.  This team includes your primary Cardiologist (physician) and Advanced Practice Providers or APPs (Physician Assistants and Nurse Practitioners) who all work together to provide you with the care you need, when you need it.  Your next appointment:   6 month(s)  Provider:   DR. HARDING   We recommend signing up for the patient portal called "MyChart".  Sign up information is provided on this After Visit Summary.  MyChart is used to connect with patients for Virtual Visits (Telemedicine).  Patients are able to view lab/test results, encounter notes, upcoming appointments, etc.  Non-urgent messages can be sent to your provider as well.   To learn more about what you can do with MyChart, go to [email or web address].   Other Instructions

## 2024-01-04 ENCOUNTER — Encounter: Payer: Self-pay | Admitting: Cardiovascular Disease

## 2024-01-04 ENCOUNTER — Other Ambulatory Visit: Payer: Self-pay | Admitting: Cardiovascular Disease

## 2024-01-21 ENCOUNTER — Telehealth: Payer: Self-pay

## 2024-01-21 NOTE — Telephone Encounter (Signed)
 Prescription Request  01/21/2024  LOV: 12/23/23  What is the name of the medication or equipment? rosuvastatin  (CRESTOR ) 40 MG tablet [[id number]]   Have you contacted your pharmacy to request a refill? Yes   Which pharmacy would you like this sent to?  [id number] - [place] ([place] - [address] [place] ([place]) [place] [address] Phone: [phone] Fax: [phone]    Patient notified that their request is being sent to the clinical staff for review and that they should receive a response within 2 business days.   Please advise at [facility] [phone]

## 2024-01-23 ENCOUNTER — Encounter: Payer: Self-pay | Admitting: Cardiovascular Disease

## 2024-02-14 ENCOUNTER — Other Ambulatory Visit: Payer: Self-pay | Admitting: Family Medicine

## 2024-02-14 DIAGNOSIS — E785 Hyperlipidemia, unspecified: Secondary | ICD-10-CM

## 2024-03-11 ENCOUNTER — Other Ambulatory Visit: Payer: Self-pay | Admitting: Family Medicine

## 2024-03-11 DIAGNOSIS — E785 Hyperlipidemia, unspecified: Secondary | ICD-10-CM

## 2024-03-12 NOTE — Telephone Encounter (Signed)
 LOV 12/23/2023 and all labs in date.   All criteria met.  Requested Prescriptions  Pending Prescriptions Disp Refills   hydrochlorothiazide  (HYDRODIURIL ) 25 MG tablet [Pharmacy Med Name: hydroCHLOROthiazide  25 MG Oral Tablet] 90 tablet 0    Sig: TAKE 1 TABLET BY MOUTH ONCE DAILY . APPOINTMENT REQUIRED FOR FUTURE REFILLS     Cardiovascular: Diuretics - Thiazide Passed - 03/12/2024  2:58 PM      Passed - Cr in normal range and within 180 days    Creat  Date Value Ref Range Status  12/23/2023 1.18 0.70 - 1.28 mg/dL Final         Passed - K in normal range and within 180 days    Potassium  Date Value Ref Range Status  12/23/2023 3.9 3.5 - 5.3 mmol/L Final         Passed - Na in normal range and within 180 days    Sodium  Date Value Ref Range Status  12/23/2023 137 135 - 146 mmol/L Final  04/25/2017 143 134 - 144 mmol/L Final         Passed - Last BP in normal range    BP Readings from Last 1 Encounters:  01/02/24 98/64         Passed - Valid encounter within last 6 months    Recent Outpatient Visits           2 months ago Coronary artery disease involving native coronary artery of native heart without angina pectoris   [place] [facility] Duanne Butler DASEN, MD   3 months ago Acute bronchitis, unspecified organism   [place] [facility] Aletha Bene, MD   8 months ago Coronary artery disease involving native coronary artery of native heart without angina pectoris   [place] [facility] Pickard, Butler DASEN, MD   1 year ago Decreased pedal pulses   [place] [facility] Duanne Butler DASEN, MD   1 year ago Right sided abdominal pain   [place] [facility] Pickard, Butler DASEN, MD               rosuvastatin  (CRESTOR ) 40 MG tablet [Pharmacy Med Name: Rosuvastatin  Calcium  40 MG Oral Tablet] 90 tablet 0    Sig: Take 1 tablet by mouth once daily     Cardiovascular:  Antilipid -  Statins 2 Failed - 03/12/2024  2:58 PM      Failed - Lipid Panel in normal range within the last 12 months    Cholesterol, Total  Date Value Ref Range Status  04/25/2017 88 (L) 100 - 199 mg/dL Final   Cholesterol  Date Value Ref Range Status  12/23/2023 96 <200 mg/dL Final   LDL Cholesterol (Calc)  Date Value Ref Range Status  12/23/2023 44 mg/dL (calc) Final    Comment:    Reference range: <100 . Desirable range <100 mg/dL for primary prevention;   <70 mg/dL for patients with CHD or diabetic patients  with > or = 2 CHD risk factors. SABRA LDL-C is now calculated using the Martin-Hopkins  calculation, which is a validated novel method providing  better accuracy than the Friedewald equation in the  estimation of LDL-C.  Gladis APPLETHWAITE et al. SANDREA. 7986;689(80): 2061-2068  (http://education.[employer].com/faq/FAQ164)    HDL  Date Value Ref Range Status  12/23/2023 29 (L) > OR = 40 mg/dL Final  91/69/7981 31 (L) >39 mg/dL Final   Triglycerides  Date Value Ref  Range Status  12/23/2023 148 <150 mg/dL Final         Passed - Cr in normal range and within 360 days    Creat  Date Value Ref Range Status  12/23/2023 1.18 0.70 - 1.28 mg/dL Final         Passed - Patient is not pregnant      Passed - Valid encounter within last 12 months    Recent Outpatient Visits           2 months ago Coronary artery disease involving native coronary artery of native heart without angina pectoris   [place] [facility] Pickard, Butler DASEN, MD   3 months ago Acute bronchitis, unspecified organism   [place] [facility] Medicine Aletha Bene, MD   8 months ago Coronary artery disease involving native coronary artery of native heart without angina pectoris   [place] [facility] Pickard, Butler DASEN, MD   1 year ago Decreased pedal pulses   [place] [facility] Medicine Duanne, Butler DASEN, MD   1 year ago Right sided abdominal pain    [place] [facility] Medicine Pickard, Butler DASEN, MD

## 2024-05-28 ENCOUNTER — Other Ambulatory Visit: Payer: Self-pay | Admitting: Family Medicine

## 2024-06-11 ENCOUNTER — Other Ambulatory Visit: Payer: Self-pay | Admitting: Family Medicine

## 2024-07-02 ENCOUNTER — Ambulatory Visit: Payer: Self-pay

## 2024-07-02 NOTE — Telephone Encounter (Signed)
 FYI Only or Action Required?: FYI only for provider: appointment scheduled on 11/7.  Patient was last seen in primary care on 12/23/2023 by Don Butler DASEN, MD.  Called Nurse Triage reporting Cough.  Symptoms began yesterday.  Interventions attempted: OTC medications: Tylenol , Cough Medication.  Symptoms are: gradually worsening.  Triage Disposition: Call PCP Within 24 Hours  Patient/caregiver understands and will follow disposition?: Yes        Copied from CRM [id number]. Topic: Clinical - Red Word Triage >> Jul 02, 2024  9:48 AM Tiffini S wrote: [name] that prompted transfer to Nurse Triage: Patient spouse test positive for COVID- the patient is coughing w/ phlegm, chills, fever, hot/ cold, body aches/ fatigues, slight headache   Patient symptoms start on 07/01/24 yesterday Reason for Disposition  [1] HIGH RISK patient (e.g., weak immune system, 65 years and older, obesity with BMI 30 or higher, pregnant, chronic lung disease) AND [2] COVID symptoms (e.g., cough, fever)  (Exceptions: Already seen by PCP and no new or worsening symptoms.)  Answer Assessment - Initial Assessment Questions 1. COVID-19 EXPOSURE: Please describe how you were exposed to someone with a COVID-19 infection.     Wife was diagnosed with Covid with positive at home test last Monday. She is taking Paxlovid, but patient tested negative, but symptoms have appeared yesterday. He did receive a positive yesterday night.    2. PLACE of CONTACT: Where were you when you were exposed to COVID-19? (e.g., home, school, medical waiting room; which city?)     Home   3. TYPE of CONTACT: How much contact was there? (e.g., sitting next to, live in same house, work in same office, same building)     Hours    7. SYMPTOMS: Do you have any symptoms? (e.g., fever, cough, breathing difficulty, loss of taste or smell)  the patient is coughing w/ phlegm, chills, fever, hot/ cold, body aches/ fatigues, slight headache         8. COVID-19 VACCINE: Have you had the COVID-19 vaccine? If Yes, ask: When did you last get it?  Not this year, but previously.    10. HIGH RISK: Do you have any heart or lung problems? (e.g., asthma, COPD, heart failure) Do you have a weak immune system or other risk factors? (e.g., HIV positive, chemotherapy, renal failure, diabetes mellitus, sickle cell anemia, obesity)   Asthma, and age.     The patient is coughing w/ phlegm, chills, fever, hot/ cold, body aches/ fatigues, slight headache. Patient symptoms start on 07/01/24, appt. scheduled  Protocols used: COVID-19 - Exposure-A-AH, COVID-19 - Diagnosed or Suspected-A-AH

## 2024-07-03 ENCOUNTER — Ambulatory Visit: Admitting: Family Medicine

## 2024-07-03 ENCOUNTER — Telehealth: Payer: Self-pay

## 2024-07-03 ENCOUNTER — Ambulatory Visit: Payer: Self-pay

## 2024-07-03 ENCOUNTER — Other Ambulatory Visit: Payer: Self-pay | Admitting: Family Medicine

## 2024-07-03 MED ORDER — NIRMATRELVIR/RITONAVIR (PAXLOVID)TABLET: 3.0000 | ORAL_TABLET | Freq: Two times a day (BID) | ORAL | 0 refills | Status: AC

## 2024-07-03 NOTE — Telephone Encounter (Signed)
 FYI Only or Action Required?: Action required by provider: requesting medications for covid.  Patient was last seen in primary care on 12/23/2023 by Duanne Butler DASEN, MD.  Called Nurse Triage reporting Covid Positive.  Symptoms began several days ago.  Interventions attempted: Rest, hydration, or home remedies.  Symptoms are: stable.  Triage Disposition: Information or Advice Only Call  Patient/caregiver understands and will follow disposition?: Yes  Reason for Disposition  Health information question, no triage required and triager able to answer question  Answer Assessment - Initial Assessment Questions Patient's wife Meade stated the nurse called and stated they may not need to come in and something could be called in to pharmacy for symptoms, patient and wife never heard back so missed this mornings appointments and still does not know what's going on. CMA noted they could send something in or change visit to virtual and asked how patient would like to proceed. Since appointment was already missed, patient would like something sent to [facility] [place] on file, please advise.   1. REASON FOR CALL: What is the main reason for your call? or How can I best help you?     Patient's wife calling in due to miscommunication regarding appointment and getting medication called in for COVID.   2. SYMPTOMS : Do you have any symptoms?      Nothing new or worsening - cough, body aches, fatigue. Current temp 98.5  Protocols used: Information Only Call - No Triage-A-AH  Copied from CRM [id number]. Topic: Clinical - Red Word Triage >> Jul 02, 2024  9:48 AM Tiffini S wrote: [name] that prompted transfer to Nurse Triage: Patient spouse test positive for COVID- the patient is coughing w/ phlegm, chills, fever, hot/ cold, body aches/ fatigues, slight headache   Patient symptoms start on 07/01/24 yesterday >> Jul 03, 2024 11:08 AM Montie POUR wrote: Ms. Bolls has not received a call  back from Dr. Clara nurse. He still has all the above symptoms and that is why I am transferring back to triage nurse.

## 2024-07-03 NOTE — Telephone Encounter (Signed)
 Copied from CRM [id number]. Topic: Clinical - Medical Advice >> Jul 03, 2024  2:40 PM Leonette P wrote: Reason for CRM: Patients wife called saying the Paxlovid that was sent in was too expensive.  She wants to know is there anything else or should they just wait it out.SABRASABRA

## 2024-07-03 NOTE — Telephone Encounter (Signed)
 Pt called in and tested + for Covid on Wednesday night. Pt c/o congestion and coughing. Pt asks if something can be called in to help with the Covid and coughing? Thanks.

## 2024-07-30 ENCOUNTER — Other Ambulatory Visit: Payer: Self-pay | Admitting: Family Medicine

## 2024-08-03 NOTE — Telephone Encounter (Signed)
 Requested medication (s) are due for refill today: yes  Requested medication (s) are on the active medication list: yes  Last refill:  06/11/24  Future visit scheduled: no  Notes to clinic:  Unable to refill per protocol, courtesy refill already given, routing for provider approval.      Requested Prescriptions  Pending Prescriptions Disp Refills   hydrochlorothiazide  (HYDRODIURIL ) 25 MG tablet [Pharmacy Med Name: hydroCHLOROthiazide  25 MG Oral Tablet] 90 tablet 0    Sig: TAKE 1 TABLET BY MOUTH ONCE DAILY. APPOINTMENT REQUIRED FOR FUTURE REFILLS.     Cardiovascular: Diuretics - Thiazide Failed - 08/03/2024  8:17 AM      Failed - Cr in normal range and within 180 days    Creat  Date Value Ref Range Status  12/23/2023 1.18 0.70 - 1.28 mg/dL Final         Failed - K in normal range and within 180 days    Potassium  Date Value Ref Range Status  12/23/2023 3.9 3.5 - 5.3 mmol/L Final         Failed - Na in normal range and within 180 days    Sodium  Date Value Ref Range Status  12/23/2023 137 135 - 146 mmol/L Final  04/25/2017 143 134 - 144 mmol/L Final         Failed - Valid encounter within last 6 months    Recent Outpatient Visits           7 months ago Coronary artery disease involving native coronary artery of native heart without angina pectoris   [place] [facility] Duanne Butler DASEN, MD   8 months ago Acute bronchitis, unspecified organism   [place] [facility] Aletha Bene, MD   1 year ago Coronary artery disease involving native coronary artery of native heart without angina pectoris   [place] [facility] Pickard, Butler DASEN, MD   1 year ago Decreased pedal pulses   [place] [facility] Duanne, Butler DASEN, MD   1 year ago Right sided abdominal pain   [place] [facility] Duanne Butler DASEN, MD              Passed - Last BP in normal range    BP Readings  from Last 1 Encounters:  01/02/24 98/64

## 2024-08-04 ENCOUNTER — Other Ambulatory Visit: Payer: Self-pay | Admitting: Family Medicine

## 2024-08-09 ENCOUNTER — Other Ambulatory Visit: Payer: Self-pay | Admitting: Family Medicine

## 2024-08-09 DIAGNOSIS — E785 Hyperlipidemia, unspecified: Secondary | ICD-10-CM

## 2024-08-10 ENCOUNTER — Ambulatory Visit: Admitting: Family Medicine

## 2024-08-10 ENCOUNTER — Encounter: Payer: Self-pay | Admitting: Family Medicine

## 2024-08-10 VITALS — BP 126/62 | HR 64 | Temp 97.9°F | Ht 69.0 in | Wt 220.4 lb

## 2024-08-10 DIAGNOSIS — I10 Essential (primary) hypertension: Secondary | ICD-10-CM

## 2024-08-10 DIAGNOSIS — Z125 Encounter for screening for malignant neoplasm of prostate: Secondary | ICD-10-CM | POA: Diagnosis not present

## 2024-08-10 DIAGNOSIS — E785 Hyperlipidemia, unspecified: Secondary | ICD-10-CM

## 2024-08-10 DIAGNOSIS — Z23 Encounter for immunization: Secondary | ICD-10-CM | POA: Diagnosis not present

## 2024-08-10 DIAGNOSIS — I2510 Atherosclerotic heart disease of native coronary artery without angina pectoris: Secondary | ICD-10-CM

## 2024-08-10 MED ORDER — HYDROCHLOROTHIAZIDE 25 MG PO TABS: 25.0000 mg | ORAL_TABLET | Freq: Every day | ORAL | 1 refills | Status: AC

## 2024-08-10 MED ORDER — ROSUVASTATIN CALCIUM 40 MG PO TABS: 40.0000 mg | ORAL_TABLET | Freq: Every day | ORAL | 1 refills | Status: AC

## 2024-08-10 NOTE — Addendum Note (Signed)
 Addended by: ANGELENA RONAL BRADLEY K on: 08/10/2024 10:18 AM   Modules accepted: Orders

## 2024-08-10 NOTE — Progress Notes (Signed)
 Subjective:    Patient ID: Don Aguilar, male    DOB: July 09, 1951, 73 y.o.   MRN: [id number]   Patient is a very pleasant 73 year old African-American gentleman who has a history of coronary artery disease.  This was treated with CABG. today on exam, he has a regularly irregular rhythm.  He is in bigeminy today.  This has been a chronic issue for the patient and is well-documented on previous EKGs.  He denies any chest pain or shortness of breath or dyspnea on exertion.  He states that he had his flu shot at his local pharmacy.  He has had a history of shingles.  However he is due for his pneumonia shot.  He is also due for prostate cancer screening.  He does occasionally have nocturia 1 time per night.  He denies any hesitancy or urgency or frequency. Past Medical History:  Diagnosis Date   Coronary artery disease    GERD (gastroesophageal reflux disease)    Hyperlipidemia    Hypertension 2010   Past Surgical History:  Procedure Laterality Date   CARDIAC CATHETERIZATION  06/08/2009   No intervention - recommend CABG   CARDIOVASCULAR STRESS TEST  05/30/2012   Small area of possible ischemia in basal septal wall. ECG negative for ischemia, but frequent PVCs continued from mid to peak exercise with  Bigeminy pattern.   CORONARY ARTERY BYPASS GRAFT  06/10/2009   x4. LIMA to LAD, SVG to first diag, SVG to circumflex, SVG to RCA. Endoscopic vein harvest from right thigh and right lower leg.   HAND TENDON SURGERY  left hand, 1972   TEE WITHOUT CARDIOVERSION  06/10/2009   EF 60-65%, Normal-trace findings   VASCULAR EVALUATION  06/09/2009   No significant extracranial carotid artery stenosis demonstrated. Vertebrals are patent with antegrade flow.   Current Outpatient Medications on File Prior to Visit  Medication Sig Dispense Refill   albuterol  (VENTOLIN  HFA) 108 (90 Base) MCG/ACT inhaler Inhale 1-2 puffs into the lungs every 6 (six) hours as needed for wheezing or shortness of breath. 1  each 0   aspirin 81 MG tablet Take 81 mg by mouth daily.     metoprolol  tartrate (LOPRESSOR ) 100 MG tablet Take 1 tablet by mouth twice daily 180 tablet 3   omeprazole  (PRILOSEC) 20 MG capsule Take 1 capsule by mouth twice daily 180 capsule 0   ramipril  (ALTACE ) 5 MG capsule Take 1 capsule (5 mg total) by mouth daily. 90 capsule 3   No current facility-administered medications on file prior to visit.   Allergies  Allergen Reactions   Niaspan [Niacin Er (Antihyperlipidemic)] Itching    Lower legs   Social History   Socioeconomic History   Marital status: Married    Spouse name: Not on file   Number of children: Not on file   Years of education: Not on file   Highest education level: Not on file  Occupational History   Not on file  Tobacco Use   Smoking status: Former    Current packs/day: 0.00    Average packs/day: 2.0 packs/day for 13.0 years (26.0 ttl pk-yrs)    Types: Cigarettes    Start date: 03/16/1963    Quit date: 03/15/1976    Years since quitting: 48.4   Smokeless tobacco: Never  Vaping Use   Vaping status: Never Used  Substance and Sexual Activity   Alcohol use: No    Alcohol/week: 0.0 standard drinks of alcohol   Drug use: No   Sexual activity:  Not on file  Other Topics Concern   Not on file  Social History Narrative   Not on file   Social Drivers of Health   Tobacco Use: Medium Risk (08/10/2024)   Patient History    Smoking Tobacco Use: Former    Smokeless Tobacco Use: Never    Passive Exposure: Not on file  Financial Resource Strain: Patient Declined (06/26/2023)   Overall Financial Resource Strain (CARDIA)    Difficulty of Paying Living Expenses: Patient declined  Food Insecurity: Patient Declined (06/26/2023)   Hunger Vital Sign    Worried About Running Out of Food in the Last Year: Patient declined    Ran Out of Food in the Last Year: Patient declined  Transportation Needs: Patient Declined (06/26/2023)   PRAPARE - Scientist, Research (physical Sciences) (Medical): Patient declined    Lack of Transportation (Non-Medical): Patient declined  Physical Activity: Insufficiently Active (06/26/2023)   Exercise Vital Sign    Days of Exercise per Week: 1 day    Minutes of Exercise per Session: 20 min  Stress: Patient Declined (06/26/2023)   [employer] of Occupational Health - Occupational Stress Questionnaire    Feeling of Stress : Patient declined  Social Connections: Unknown (06/26/2023)   Social Connection and Isolation Panel    Frequency of Communication with Friends and Family: Patient declined    Frequency of Social Gatherings with Friends and Family: Once a week    Attends Religious Services: Patient declined    Database Administrator or Organizations: Patient declined    Attends Banker Meetings: Not on file    Marital Status: Married  Intimate Partner Violence: Not on file  Depression (PHQ2-9): Low Risk (08/10/2024)   Depression (PHQ2-9)    PHQ-2 Score: 0  Alcohol Screen: Not on file  Housing: Low Risk (06/26/2023)   Housing    Last Housing Risk Score: 0  Utilities: Not on file  Health Literacy: Not on file      Review of Systems  Gastrointestinal:  Positive for diarrhea.  All other systems reviewed and are negative.      Objective:   Physical Exam Vitals reviewed.  Constitutional:      Appearance: Normal appearance.  Cardiovascular:     Rate and Rhythm: Normal rate and regular rhythm.     Heart sounds: Normal heart sounds. No murmur heard.    No friction rub. No gallop.  Pulmonary:     Effort: Pulmonary effort is normal. No respiratory distress.     Breath sounds: Normal breath sounds. No stridor.  Abdominal:     General: Bowel sounds are normal. There is no distension.     Palpations: Abdomen is soft.     Tenderness: There is no abdominal tenderness. There is no guarding or rebound.  Musculoskeletal:     Right lower leg: No edema.     Left lower leg: No edema.  Feet:      Right foot:     Skin integrity: Skin integrity normal. No ulcer.     Left foot:     Skin integrity: Skin integrity normal.  Neurological:     Mental Status: He is alert.           Assessment & Plan:  Primary hypertension - Plan: hydrochlorothiazide  (HYDRODIURIL ) 25 MG tablet, CBC with Differential/Platelet, Comprehensive metabolic panel with GFR, Lipid panel  Hyperlipidemia with target LDL less than 70 - Plan: rosuvastatin  (CRESTOR ) 40 MG tablet  Coronary artery disease involving  native coronary artery of native heart without angina pectoris - Plan: CBC with Differential/Platelet, Comprehensive metabolic panel with GFR, Lipid panel  Prostate cancer screening - Plan: PSA Patient's blood pressure today is outstanding at 126/62.  He is in bigeminy but he is asymptomatic from this.  I will check a fasting lipid panel.  I would like to see his LDL cholesterol less than 55 if possible.  I will also monitor his blood sugar.  While checking lab work today we will check a PSA.  The patient was also given Prevnar 20 today.  He declines the shingles vaccine.

## 2024-08-11 ENCOUNTER — Ambulatory Visit: Payer: Self-pay | Admitting: Family Medicine

## 2024-08-12 LAB — COMPREHENSIVE METABOLIC PANEL WITH GFR
AG Ratio: 1.7 (calc) (ref 1.0–2.5)
ALT: 20 U/L (ref 9–46)
AST: 22 U/L (ref 10–35)
Albumin: 4.1 g/dL (ref 3.6–5.1)
Alkaline phosphatase (APISO): 75 U/L (ref 35–144)
BUN: 19 mg/dL (ref 7–25)
CO2: 26 mmol/L (ref 20–32)
Calcium: 9 mg/dL (ref 8.6–10.3)
Chloride: 103 mmol/L (ref 98–110)
Creat: 1.23 mg/dL (ref 0.70–1.28)
Globulin: 2.4 g/dL (ref 1.9–3.7)
Glucose, Bld: 139 mg/dL — ABNORMAL HIGH (ref 65–99)
Potassium: 3.9 mmol/L (ref 3.5–5.3)
Sodium: 140 mmol/L (ref 135–146)
Total Bilirubin: 0.4 mg/dL (ref 0.2–1.2)
Total Protein: 6.5 g/dL (ref 6.1–8.1)
eGFR: 62 mL/min/1.73m2 (ref >=60)

## 2024-08-12 LAB — LIPID PANEL
Cholesterol: 82 mg/dL (ref <200)
HDL: 29 mg/dL — ABNORMAL LOW (ref >=40)
LDL Cholesterol (Calc): 34 mg/dL
Non-HDL Cholesterol (Calc): 53 mg/dL (ref <130)
Total CHOL/HDL Ratio: 2.8 (calc) (ref <5.0)
Triglycerides: 109 mg/dL (ref <150)

## 2024-08-12 LAB — HEMOGLOBIN A1C
Hgb A1c MFr Bld: 6.3 % — ABNORMAL HIGH (ref <5.7)
Mean Plasma Glucose: 134 mg/dL
eAG (mmol/L): 7.4 mmol/L

## 2024-08-12 LAB — CBC WITH DIFFERENTIAL/PLATELET
Absolute Lymphocytes: 1843 {cells}/uL (ref 850–3900)
Absolute Monocytes: 670 {cells}/uL (ref 200–950)
Basophils Absolute: 29 {cells}/uL (ref 0–200)
Basophils Relative: 0.4 %
Eosinophils Absolute: 130 {cells}/uL (ref 15–500)
Eosinophils Relative: 1.8 %
HCT: 42.6 % (ref 39.4–51.1)
Hemoglobin: 13.9 g/dL (ref 13.2–17.1)
MCH: 28.8 pg (ref 27.0–33.0)
MCHC: 32.6 g/dL (ref 31.6–35.4)
MCV: 88.2 fL (ref 81.4–101.7)
MPV: 12 fL (ref 7.5–12.5)
Monocytes Relative: 9.3 %
Neutro Abs: 4529 {cells}/uL (ref 1500–7800)
Neutrophils Relative %: 62.9 %
Platelets: 189 Thousand/uL (ref 140–400)
RBC: 4.83 Million/uL (ref 4.20–5.80)
RDW: 12.6 % (ref 11.0–15.0)
Total Lymphocyte: 25.6 %
WBC: 7.2 Thousand/uL (ref 3.8–10.8)

## 2024-08-12 LAB — TEST AUTHORIZATION

## 2024-08-12 LAB — PSA: PSA: 0.54 ng/mL (ref <=4.00)

## 2024-08-13 ENCOUNTER — Telehealth: Payer: Self-pay | Admitting: Family Medicine

## 2024-08-13 DIAGNOSIS — I10 Essential (primary) hypertension: Secondary | ICD-10-CM

## 2024-08-13 NOTE — Telephone Encounter (Signed)
 Patient called, left VM to return the call to the office to schedule an OV for follow up.  Medication was refused noting patient needs an appointment. After hanging up, realized medication was filled on 08/10/24 after OV. Patient doesn't need appointment at this time.   Copied from CRM #[id number]. Topic: Clinical - Prescription Issue >> Aug 13, 2024 11:55 AM Berneda FALCON wrote: Reason for CRM: Patient states this medication was not filled. Notes showing there was an issue with the e-scribe of it. Patient is completely out of it.  Medication: hydrochlorothiazide  (HYDRODIURIL ) 25 MG tablet   Pharmacy: [id number] - [place] ([place]), [place] - [address] [place] [address] Phone: [phone] Fax: [phone] Hours: Not open 24 hours
